# Patient Record
Sex: Male | Born: 1945 | Race: White | Hispanic: No | Smoking: Former smoker
Health system: Southern US, Community
[De-identification: ages and names within clinical notes are randomized; demographics above are authoritative.]

## PROBLEM LIST (undated history)

## (undated) DIAGNOSIS — E7800 Pure hypercholesterolemia, unspecified: Secondary | ICD-10-CM

## (undated) DIAGNOSIS — Z87891 Personal history of nicotine dependence: Secondary | ICD-10-CM

## (undated) DIAGNOSIS — K219 Gastro-esophageal reflux disease without esophagitis: Secondary | ICD-10-CM

## (undated) DIAGNOSIS — K5730 Diverticulosis of large intestine without perforation or abscess without bleeding: Secondary | ICD-10-CM

## (undated) DIAGNOSIS — R1310 Dysphagia, unspecified: Secondary | ICD-10-CM

## (undated) DIAGNOSIS — F419 Anxiety disorder, unspecified: Secondary | ICD-10-CM

## (undated) DIAGNOSIS — D490 Neoplasm of unspecified behavior of digestive system: Secondary | ICD-10-CM

## (undated) DIAGNOSIS — I10 Essential (primary) hypertension: Secondary | ICD-10-CM

## (undated) DIAGNOSIS — M1712 Unilateral primary osteoarthritis, left knee: Secondary | ICD-10-CM

## (undated) DIAGNOSIS — J189 Pneumonia, unspecified organism: Secondary | ICD-10-CM

## (undated) DIAGNOSIS — N529 Male erectile dysfunction, unspecified: Secondary | ICD-10-CM

## (undated) DIAGNOSIS — M1990 Unspecified osteoarthritis, unspecified site: Secondary | ICD-10-CM

## (undated) DIAGNOSIS — J449 Chronic obstructive pulmonary disease, unspecified: Secondary | ICD-10-CM

## (undated) DIAGNOSIS — I700 Atherosclerosis of aorta: Secondary | ICD-10-CM

## (undated) DIAGNOSIS — E119 Type 2 diabetes mellitus without complications: Secondary | ICD-10-CM

## (undated) HISTORY — PX: WISDOM TOOTH EXTRACTION: SHX21

## (undated) HISTORY — PX: HERNIA REPAIR: SHX51

## (undated) HISTORY — PX: COLONOSCOPY: SHX174

## (undated) HISTORY — PX: TONSILLECTOMY: SUR1361

---

## 2014-01-20 ENCOUNTER — Encounter: Payer: Self-pay | Admitting: Podiatry

## 2014-01-20 ENCOUNTER — Ambulatory Visit (INDEPENDENT_AMBULATORY_CARE_PROVIDER_SITE_OTHER): Payer: Medicare Other | Admitting: Podiatry

## 2014-01-20 VITALS — BP 153/85 | HR 62 | Resp 16 | Ht 71.0 in | Wt 200.0 lb

## 2014-01-20 DIAGNOSIS — Q828 Other specified congenital malformations of skin: Secondary | ICD-10-CM

## 2014-01-20 DIAGNOSIS — B070 Plantar wart: Secondary | ICD-10-CM

## 2014-01-20 DIAGNOSIS — M79672 Pain in left foot: Secondary | ICD-10-CM

## 2014-01-20 NOTE — Progress Notes (Signed)
   Subjective:    Patient ID: Archimedes Harold, male    DOB: [birth year], 69 y.o.   MRN: [id number]  HPI Comments: 69 year old male presents the office today with complaints of pain under his little toe on the side of the left foot. He states that the area has been present for a couple of months. He previously states that he use an over-the-counter acid/corn pad which alleviated the symptoms. He states that since stopping using this treatment the symptoms have returned. He states that he has pain to the end of day after standing on the area all day. He does not have pain in the mornings. Denies any recent injury or trauma to the area. Denies any recent redness or drainage from the site. No other complaints at this time.  Foot Pain      Review of Systems  All other systems reviewed and are negative.      Objective:   Physical Exam AAO x3, NAD DP/PT pulses palpable bilaterally, CRT less than 3 seconds Protective sensation intact with Simms Weinstein monofilament, vibratory sensation intact, Achilles tendon reflex intact Left foot submetatarsal 5 punctate hyperkeratotic lesion. Upon debridement there is no pinpoint bleeding or evidence of verruca. There is no underlying ulceration or any other clinical signs of infection at this time. No other open lesions or pre-ulcer lesions identified. There is mild discomfort rectally overlying this lesion. No other areas of pinpoint bony tenderness or pain the vibratory sensation bilaterally. There is prominent metatarsal heads plantarly with mild atrophy of the fat pad. MMT 5/5, ROM WNL No pain to calf compression, swelling, warmth, erythema.        Assessment & Plan:  69 year old male left submetatarsal 5 porokeratosis -Treatment options were discussed the patient include alternatives, risks, complications. -Lesion was sharply debrided without complications/bleeding. -A donut pad was placed around the lesion followed by salinocaine and an occlusive  dressing. Instructions were given to the patient as far as when to wash the area off. Discussed with him that if the area blisters can using antibiotic ointment and a Band-Aid overlying the area and to keep the area clean. Monitoring signs or symptoms of infection and directed to call the office should any occur. Discussed with him the lesion may likely recur.  -Follow-up as needed. In the meantime, encouraged to call the office with any questions, concerns, change in symptoms.

## 2014-01-21 ENCOUNTER — Encounter: Payer: Self-pay | Admitting: Podiatry

## 2015-02-26 ENCOUNTER — Encounter: Payer: Self-pay | Admitting: Emergency Medicine

## 2015-02-26 ENCOUNTER — Emergency Department
Admission: EM | Admit: 2015-02-26 | Discharge: 2015-02-26 | Disposition: A | Discharge status: Home / Self Care | Payer: Medicare Other | Attending: Emergency Medicine | Admitting: Emergency Medicine

## 2015-02-26 DIAGNOSIS — I10 Essential (primary) hypertension: Secondary | ICD-10-CM

## 2015-02-26 DIAGNOSIS — Z87891 Personal history of nicotine dependence: Secondary | ICD-10-CM | POA: Diagnosis not present

## 2015-02-26 DIAGNOSIS — M545 Low back pain: Secondary | ICD-10-CM | POA: Insufficient documentation

## 2015-02-26 DIAGNOSIS — R030 Elevated blood-pressure reading, without diagnosis of hypertension: Secondary | ICD-10-CM | POA: Diagnosis present

## 2015-02-26 LAB — BASIC METABOLIC PANEL
Anion gap: 4 — ABNORMAL LOW (ref 5–15)
BUN: 16 mg/dL (ref 6–20)
CHLORIDE: 104 mmol/L (ref 101–111)
CO2: 32 mmol/L (ref 22–32)
CREATININE: 0.98 mg/dL (ref 0.61–1.24)
Calcium: 9.5 mg/dL (ref 8.9–10.3)
GFR calc non Af Amer: >60 mL/min (ref >60)
Glucose, Bld: 103 mg/dL — ABNORMAL HIGH (ref 65–99)
POTASSIUM: 4.1 mmol/L (ref 3.5–5.1)
SODIUM: 140 mmol/L (ref 135–145)

## 2015-02-26 LAB — CBC
HEMATOCRIT: 44.6 % (ref 40.0–52.0)
Hemoglobin: 14.8 g/dL (ref 13.0–18.0)
MCH: 29 pg (ref 26.0–34.0)
MCHC: 33.2 g/dL (ref 32.0–36.0)
MCV: 87.2 fL (ref 80.0–100.0)
PLATELETS: 187 10*3/uL (ref 150–440)
RBC: 5.11 MIL/uL (ref 4.40–5.90)
RDW: 13.6 % (ref 11.5–14.5)
WBC: 9.6 10*3/uL (ref 3.8–10.6)

## 2015-02-26 MED ORDER — HYDROCHLOROTHIAZIDE 25 MG PO TABS: 25.0000 mg | ORAL_TABLET | Freq: Every day | ORAL | Status: DC

## 2015-02-26 NOTE — ED Notes (Signed)
Discharge instructions reviewed with patient. Pt verbalized understanding. Pt given prescription. Ambulated to lobby without difficulty or distress.

## 2015-02-26 NOTE — ED Provider Notes (Signed)
[facility] Emergency Department Provider Note     Time seen: ----------------------------------------- 2:19 PM on 02/26/2015 -----------------------------------------    I have reviewed the triage vital signs and the nursing notes.   HISTORY  Chief Complaint Hypertension    HPI Terry Sheppard is a 70 y.o. male who presents to ER after he noticed blood pressure was elevated yesterday afternoon. He took his blood pressure this morning and it was still elevated, reports slight headache.Patient reports to being under a lot of stress due to cleaning his house out. States his wife before she does not have accumulated a lot of things that he is now going through since she has passed. He is been doing a low lifting and has had some soreness in his low back. Otherwise he denies any complaints.   History reviewed. No pertinent past medical history.  There are no active problems to display for this patient.   History reviewed. No pertinent past surgical history.  Allergies Review of patient's allergies indicates no known allergies.  Social History Social History  Substance Use Topics  . Smoking status: Former Research scientist (life sciences)  . Smokeless tobacco: None  . Alcohol Use: 0.0 oz/week    0 Standard drinks or equivalent per week     Comment: rarely    Review of Systems Constitutional: Negative for fever. Eyes: Negative for visual changes. ENT: Negative for sore throat. Cardiovascular: Negative for chest pain. Respiratory: Negative for shortness of breath. Gastrointestinal: Negative for abdominal pain, vomiting and diarrhea. Genitourinary: Negative for dysuria. Musculoskeletal: Positive for soreness in his low back Skin: Negative for rash. Neurological: Positive for slight headache  10-point ROS otherwise negative.  ____________________________________________   PHYSICAL EXAM:  VITAL SIGNS: ED Triage Vitals  Enc Vitals Group     BP 02/26/15 1215 168/91  mmHg     Pulse Rate 02/26/15 1215 57     Resp 02/26/15 1215 18     Temp 02/26/15 1215 98 F (36.7 C)     Temp Source 02/26/15 1215 Oral     SpO2 02/26/15 1215 98 %     Weight 02/26/15 1215 200 lb (90.719 kg)     Height 02/26/15 1215 5\' 11"  (1.803 m)     Head Cir --      Peak Flow --      Pain Score --      Pain Loc --      Pain Edu? --      Excl. in [place]? --     Constitutional: Alert and oriented. Well appearing and in no distress. Eyes: Conjunctivae are normal. PERRL. Normal extraocular movements. ENT   Head: Normocephalic and atraumatic.   Nose: No congestion/rhinnorhea.   Mouth/Throat: Mucous membranes are moist.   Neck: No stridor. Cardiovascular: Normal rate, regular rhythm. Normal and symmetric distal pulses are present in all extremities. No murmurs, rubs, or gallops. Respiratory: Normal respiratory effort without tachypnea nor retractions. Breath sounds are clear and equal bilaterally. No wheezes/rales/rhonchi. Gastrointestinal: Soft and nontender. No distention. No abdominal bruits.  Musculoskeletal: Nontender with normal range of motion in all extremities. No joint effusions.  No lower extremity tenderness nor edema. Neurologic:  Normal speech and language. No gross focal neurologic deficits are appreciated. Speech is normal. No gait instability. Skin:  Skin is warm, dry and intact. No rash noted. Psychiatric: Mood and affect are normal. Speech and behavior are normal. Patient exhibits appropriate insight and judgment. ____________________________________________  EKG: Interpreted by me. Sinus bradycardia with rate of 58 bpm, normal  PR interval, normal QRS, normal QT interval. Normal axis. no evidence of acute infarction  ____________________________________________  ED COURSE:  Pertinent labs & imaging results that were available during my care of the patient were reviewed by me and considered in my medical decision making (see chart for details). Patient  is in no acute distress, appears well clinically. Essentially with a symptomatically hypertension. Labs were drawn prior to my evaluation ____________________________________________    LABS (pertinent positives/negatives)  Labs Reviewed  BASIC METABOLIC PANEL - Abnormal; Notable for the following:    Glucose, Bld 103 (*)    Anion gap 4 (*)    All other components within normal limits  CBC  CBG MONITORING, ED  ____________________________________________  FINAL ASSESSMENT AND PLAN  Hypertension  Plan: Patient with labs as dictated above. Patient looks well, advised his blood pressure may be elevated from the stress that he has been under and his back pain. I will prescribe HCTZ for him to start taking and has been referred to primary care doctor for follow-up.   Earleen Newport, MD   Earleen Newport, MD 02/26/15 [phone]

## 2015-02-26 NOTE — Discharge Instructions (Signed)
Hypertension Hypertension, commonly called high blood pressure, is when the force of blood pumping through your arteries is too strong. Your arteries are the blood vessels that carry blood from your heart throughout your body. A blood pressure reading consists of a higher number over a lower number, such as 110/72. The higher number (systolic) is the pressure inside your arteries when your heart pumps. The lower number (diastolic) is the pressure inside your arteries when your heart relaxes. Ideally you want your blood pressure below 120/80. Hypertension forces your heart to work harder to pump blood. Your arteries may become narrow or stiff. Having untreated or uncontrolled hypertension can cause heart attack, stroke, kidney disease, and other problems. RISK FACTORS Some risk factors for high blood pressure are controllable. Others are not.  Risk factors you cannot control include:   Race. You may be at higher risk if you are African American.  Age. Risk increases with age.  Gender. Men are at higher risk than women before age 45 years. After age 65, women are at higher risk than men. Risk factors you can control include:  Not getting enough exercise or physical activity.  Being overweight.  Getting too much fat, sugar, calories, or salt in your diet.  Drinking too much alcohol. SIGNS AND SYMPTOMS Hypertension does not usually cause signs or symptoms. Extremely high blood pressure (hypertensive crisis) may cause headache, anxiety, shortness of breath, and nosebleed. DIAGNOSIS To check if you have hypertension, your health care provider will measure your blood pressure while you are seated, with your arm held at the level of your heart. It should be measured at least twice using the same arm. Certain conditions can cause a difference in blood pressure between your right and left arms. A blood pressure reading that is higher than normal on one occasion does not mean that you need treatment. If  it is not clear whether you have high blood pressure, you may be asked to return on a different day to have your blood pressure checked again. Or, you may be asked to monitor your blood pressure at home for 1 or more weeks. TREATMENT Treating high blood pressure includes making lifestyle changes and possibly taking medicine. Living a healthy lifestyle can help lower high blood pressure. You may need to change some of your habits. Lifestyle changes may include:  Following the DASH diet. This diet is high in fruits, vegetables, and whole grains. It is low in salt, red meat, and added sugars.  Keep your sodium intake below 2,300 mg per day.  Getting at least 30-45 minutes of aerobic exercise at least 4 times per week.  Losing weight if necessary.  Not smoking.  Limiting alcoholic beverages.  Learning ways to reduce stress. Your health care provider may prescribe medicine if lifestyle changes are not enough to get your blood pressure under control, and if one of the following is true:  You are 18-59 years of age and your systolic blood pressure is above 140.  You are 60 years of age or older, and your systolic blood pressure is above 150.  Your diastolic blood pressure is above 90.  You have diabetes, and your systolic blood pressure is over 140 or your diastolic blood pressure is over 90.  You have kidney disease and your blood pressure is above 140/90.  You have heart disease and your blood pressure is above 140/90. Your personal target blood pressure may vary depending on your medical conditions, your age, and other factors. HOME CARE INSTRUCTIONS    Have your blood pressure rechecked as directed by your health care provider.   Take medicines only as directed by your health care provider. Follow the directions carefully. Blood pressure medicines must be taken as prescribed. The medicine does not work as well when you skip doses. Skipping doses also puts you at risk for  problems.  Do not smoke.   Monitor your blood pressure at home as directed by your health care provider. SEEK MEDICAL CARE IF:   You think you are having a reaction to medicines taken.  You have recurrent headaches or feel dizzy.  You have swelling in your ankles.  You have trouble with your vision. SEEK IMMEDIATE MEDICAL CARE IF:  You develop a severe headache or confusion.  You have unusual weakness, numbness, or feel faint.  You have severe chest or abdominal pain.  You vomit repeatedly.  You have trouble breathing. MAKE SURE YOU:   Understand these instructions.  Will watch your condition.  Will get help right away if you are not doing well or get worse.   This information is not intended to replace advice given to you by your health care provider. Make sure you discuss any questions you have with your health care provider.   Document Released: 12/26/2004 Document Revised: 05/12/2014 Document Reviewed: 10/18/2012 Elsevier Interactive Patient Education 2016 [employer]  

## 2015-02-26 NOTE — ED Notes (Addendum)
Pt comes in reporting BP higher than his normal for the past two days. Denies chest pain, SOB, N/V, dizziness. Reports mild headaches at times. Reports that he has had some back pain that he recently saw a chiropractor for. Has recently been under a lot of stress. Is not currently seeing a PCP.

## 2015-02-26 NOTE — ED Notes (Signed)
States he noticed that his blood pressure was elevated yesterday afternoon.. Then took b/p this am and noticed that b/p was elevated   Also noted that he had a slight headache

## 2015-04-15 DIAGNOSIS — I10 Essential (primary) hypertension: Secondary | ICD-10-CM | POA: Insufficient documentation

## 2015-05-25 ENCOUNTER — Ambulatory Visit
Admission: RE | Admit: 2015-05-25 | Discharge: 2015-05-25 | Disposition: A | Discharge status: Home / Self Care | Payer: Medicare Other | Source: Ambulatory Visit | Attending: Adult Health | Admitting: Adult Health

## 2015-05-25 ENCOUNTER — Other Ambulatory Visit: Payer: Self-pay | Admitting: Adult Health

## 2015-05-25 DIAGNOSIS — R1011 Right upper quadrant pain: Secondary | ICD-10-CM

## 2015-05-25 DIAGNOSIS — K7689 Other specified diseases of liver: Secondary | ICD-10-CM | POA: Insufficient documentation

## 2015-09-15 DIAGNOSIS — E119 Type 2 diabetes mellitus without complications: Secondary | ICD-10-CM | POA: Insufficient documentation

## 2017-07-18 ENCOUNTER — Other Ambulatory Visit: Payer: Self-pay | Admitting: Student

## 2017-07-18 DIAGNOSIS — Z136 Encounter for screening for cardiovascular disorders: Secondary | ICD-10-CM

## 2017-07-18 DIAGNOSIS — Z87891 Personal history of nicotine dependence: Secondary | ICD-10-CM

## 2017-07-23 ENCOUNTER — Ambulatory Visit: Payer: Medicare Other

## 2017-07-23 ENCOUNTER — Telehealth: Payer: Self-pay | Admitting: *Deleted

## 2017-07-23 NOTE — Telephone Encounter (Signed)
Patient contacted me regarding a lung cancer screening scan. Reports smoking cessation 20 + years ago. Notified patient that he is not eligible for lung cancer screening scanning due to quit date > 15 years ago. Verbalizes understanding.

## 2017-10-24 ENCOUNTER — Ambulatory Visit: Payer: Medicare Other

## 2017-10-29 ENCOUNTER — Ambulatory Visit
Admission: RE | Admit: 2017-10-29 | Discharge: 2017-10-29 | Disposition: A | Discharge status: Home / Self Care | Payer: Medicare Other | Source: Ambulatory Visit | Attending: Student | Admitting: Student

## 2017-10-29 DIAGNOSIS — I700 Atherosclerosis of aorta: Secondary | ICD-10-CM | POA: Insufficient documentation

## 2017-10-29 DIAGNOSIS — Z87891 Personal history of nicotine dependence: Secondary | ICD-10-CM | POA: Insufficient documentation

## 2017-10-29 DIAGNOSIS — Z136 Encounter for screening for cardiovascular disorders: Secondary | ICD-10-CM | POA: Diagnosis not present

## 2017-11-08 DIAGNOSIS — I700 Atherosclerosis of aorta: Secondary | ICD-10-CM | POA: Insufficient documentation

## 2018-01-17 DIAGNOSIS — N529 Male erectile dysfunction, unspecified: Secondary | ICD-10-CM | POA: Insufficient documentation

## 2018-01-17 DIAGNOSIS — J449 Chronic obstructive pulmonary disease, unspecified: Secondary | ICD-10-CM | POA: Insufficient documentation

## 2018-07-07 DIAGNOSIS — M1712 Unilateral primary osteoarthritis, left knee: Secondary | ICD-10-CM | POA: Insufficient documentation

## 2019-05-14 ENCOUNTER — Ambulatory Visit: Payer: Self-pay | Admitting: Surgery

## 2019-05-14 NOTE — H&P (Signed)
Subjective:   CC: Non-recurrent unilateral inguinal hernia without obstruction or gangrene [K40.90]  HPI:  Terry Sheppard is a 74 y.o. male who was referred by Lendon Colonel, PA for evaluation of above. Symptoms were first noted 1 month ago. Pain is sharp and burning, confined to the right groin, without radiation.  Associated with nothing specific, exacerbated by sitting, alleviated by standing and taking tylenol.     Past Medical History:  has a past medical history of Arthritis, COPD (chronic obstructive pulmonary disease) (CMS-HCC), Former tobacco use, GERD (gastroesophageal reflux disease), HLD (hyperlipidemia), PNA (pneumonia), and Right wrist fracture.  Past Surgical History:       Past Surgical History:  Procedure Laterality Date  . TONSILLECTOMY      Family History: family history includes Breast cancer (age of onset: 45) in his mother; Fibrocystic breast disease in his daughter; No Known Problems in his daughter, sister, sister, son, and son; Pancreatic cancer in his maternal grandfather; Skin cancer in his father.  Social History:  reports that he quit smoking about 22 years ago. His smoking use included cigarettes. He started smoking about 62 years ago. He smoked 2.00 packs per day. He has never used smokeless tobacco. He reports current alcohol use. He reports that he does not use drugs.  Current Medications: has a current medication list which includes the following prescription(s): aspirin, citalopram, hydrochlorothiazide, losartan, meloxicam, omega 3-dha-epa-fish oil, pravastatin, and sildenafil.  Allergies:       Allergies as of 05/14/2019 - Reviewed 05/14/2019  Allergen Reaction Noted  . Lisinopril Cough 10/13/2015    ROS:  A 15 point review of systems was performed and pertinent positives and negatives noted in HPI   Objective:   BP (!) 175/90   Pulse 61   Ht 172.7 cm (5\' 8" )   Wt 83.5 kg (184 lb)   BMI 27.98 kg/m   Constitutional :   alert, appears stated age, cooperative and no distress  Lymphatics/Throat:  no asymmetry, masses, or scars  Respiratory:  clear to auscultation bilaterally  Cardiovascular:  regular rate and rhythm  Gastrointestinal: soft, non-tender; bowel sounds normal; no masses,  no organomegaly. inguinal hernia noted.  small, reducible, no overlying skin changes and RIGHT  Musculoskeletal: Steady gait and movement  Skin: Cool and moist, no visible surgical scars   Psychiatric: Normal affect, non-agitated, not confused       LABS:  N/A   RADS: N/A Assessment:       Non-recurrent unilateral inguinal hernia without obstruction or gangrene [K40.90] RIGHT  Plan:   1. Non-recurrent unilateral inguinal hernia without obstruction or gangrene [K40.90]   Discussed the risk of surgery including recurrence, which can be up to 50% in the case of incisional or complex hernias, possible use of prosthetic materials (mesh) and the increased risk of mesh infxn if used, bleeding, chronic pain, post-op infxn, post-op SBO or ileus, and possible re-operation to address said risks. The risks of general anesthetic, if used, includes MI, CVA, sudden death or even reaction to anesthetic medications also discussed. Alternatives include continued observation.  Benefits include possible symptom relief, prevention of incarceration, strangulation, enlargement in size over time, and the risk of emergency surgery in the face of strangulation.   Typical post-op recovery time of 3-5 days with 2 weeks of activity restrictions were also discussed.  ED return precautions given for sudden increase in pain, size of hernia with accompanying fever, nausea, and/or vomiting.  The patient verbalized understanding and all questions were answered  to the patient's satisfaction.   2. Patient has elected to proceed with surgical treatment. Procedure will be scheduled.  Written consent was obtained.Marland Kitchen RIGHT, robotic assisted  laparoscopic

## 2019-05-14 NOTE — H&P (View-Only) (Signed)
Subjective:   CC: Non-recurrent unilateral inguinal hernia without obstruction or gangrene [K40.90]  HPI:  Terry Sheppard is a 74 y.o. male who was referred by Lendon Colonel, PA for evaluation of above. Symptoms were first noted 1 month ago. Pain is sharp and burning, confined to the right groin, without radiation.  Associated with nothing specific, exacerbated by sitting, alleviated by standing and taking tylenol.     Past Medical History:  has a past medical history of Arthritis, COPD (chronic obstructive pulmonary disease) (CMS-HCC), Former tobacco use, GERD (gastroesophageal reflux disease), HLD (hyperlipidemia), PNA (pneumonia), and Right wrist fracture.  Past Surgical History:       Past Surgical History:  Procedure Laterality Date  . TONSILLECTOMY      Family History: family history includes Breast cancer (age of onset: 71) in his mother; Fibrocystic breast disease in his daughter; No Known Problems in his daughter, sister, sister, son, and son; Pancreatic cancer in his maternal grandfather; Skin cancer in his father.  Social History:  reports that he quit smoking about 22 years ago. His smoking use included cigarettes. He started smoking about 62 years ago. He smoked 2.00 packs per day. He has never used smokeless tobacco. He reports current alcohol use. He reports that he does not use drugs.  Current Medications: has a current medication list which includes the following prescription(s): aspirin, citalopram, hydrochlorothiazide, losartan, meloxicam, omega 3-dha-epa-fish oil, pravastatin, and sildenafil.  Allergies:       Allergies as of 05/14/2019 - Reviewed 05/14/2019  Allergen Reaction Noted  . Lisinopril Cough 10/13/2015    ROS:  A 15 point review of systems was performed and pertinent positives and negatives noted in HPI   Objective:   BP (!) 175/90   Pulse 61   Ht 172.7 cm (5\' 8" )   Wt 83.5 kg (184 lb)   BMI 27.98 kg/m   Constitutional :   alert, appears stated age, cooperative and no distress  Lymphatics/Throat:  no asymmetry, masses, or scars  Respiratory:  clear to auscultation bilaterally  Cardiovascular:  regular rate and rhythm  Gastrointestinal: soft, non-tender; bowel sounds normal; no masses,  no organomegaly. inguinal hernia noted.  small, reducible, no overlying skin changes and RIGHT  Musculoskeletal: Steady gait and movement  Skin: Cool and moist, no visible surgical scars   Psychiatric: Normal affect, non-agitated, not confused       LABS:  N/A   RADS: N/A Assessment:       Non-recurrent unilateral inguinal hernia without obstruction or gangrene [K40.90] RIGHT  Plan:   1. Non-recurrent unilateral inguinal hernia without obstruction or gangrene [K40.90]   Discussed the risk of surgery including recurrence, which can be up to 50% in the case of incisional or complex hernias, possible use of prosthetic materials (mesh) and the increased risk of mesh infxn if used, bleeding, chronic pain, post-op infxn, post-op SBO or ileus, and possible re-operation to address said risks. The risks of general anesthetic, if used, includes MI, CVA, sudden death or even reaction to anesthetic medications also discussed. Alternatives include continued observation.  Benefits include possible symptom relief, prevention of incarceration, strangulation, enlargement in size over time, and the risk of emergency surgery in the face of strangulation.   Typical post-op recovery time of 3-5 days with 2 weeks of activity restrictions were also discussed.  ED return precautions given for sudden increase in pain, size of hernia with accompanying fever, nausea, and/or vomiting.  The patient verbalized understanding and all questions were answered  to the patient's satisfaction.   2. Patient has elected to proceed with surgical treatment. Procedure will be scheduled.  Written consent was obtained.Marland Kitchen RIGHT, robotic assisted  laparoscopic

## 2019-06-03 ENCOUNTER — Encounter
Admission: RE | Admit: 2019-06-03 | Discharge: 2019-06-03 | Disposition: A | Discharge status: Home / Self Care | Payer: Medicare Other | Source: Ambulatory Visit | Attending: Surgery | Admitting: Surgery

## 2019-06-03 ENCOUNTER — Other Ambulatory Visit: Payer: Self-pay

## 2019-06-03 DIAGNOSIS — Z01818 Encounter for other preprocedural examination: Secondary | ICD-10-CM | POA: Diagnosis not present

## 2019-06-03 DIAGNOSIS — I491 Atrial premature depolarization: Secondary | ICD-10-CM | POA: Insufficient documentation

## 2019-06-03 DIAGNOSIS — R9431 Abnormal electrocardiogram [ECG] [EKG]: Secondary | ICD-10-CM | POA: Diagnosis not present

## 2019-06-03 DIAGNOSIS — I10 Essential (primary) hypertension: Secondary | ICD-10-CM | POA: Insufficient documentation

## 2019-06-03 HISTORY — DX: Unspecified osteoarthritis, unspecified site: M19.90

## 2019-06-03 HISTORY — DX: Chronic obstructive pulmonary disease, unspecified: J44.9

## 2019-06-03 HISTORY — DX: Pure hypercholesterolemia, unspecified: E78.00

## 2019-06-03 HISTORY — DX: Gastro-esophageal reflux disease without esophagitis: K21.9

## 2019-06-03 HISTORY — DX: Essential (primary) hypertension: I10

## 2019-06-03 NOTE — Patient Instructions (Addendum)
Your procedure is scheduled on: 06-13-19 FRIDAY Report to Same Day Surgery 2nd floor medical mall [facility] Entrance-take elevator on left to 2nd floor.  Check in with surgery information desk.) To find out your arrival time please call [phone] between 1PM - 3PM on 06-12-19 THURSDAY  Remember: Instructions that are not followed completely may result in serious medical risk, up to and including death, or upon the discretion of your surgeon and anesthesiologist your surgery may need to be rescheduled.    _x___ 1. Do not eat food after midnight the night before your procedure. NO GUM OR CANDY AFTER MIDNIGHT. You may drink clear liquids up to 2 hours before you are scheduled to arrive at the hospital for your procedure.  Do not drink clear liquids within 2 hours of your scheduled arrival to the hospital.  Clear liquids include  --Water or Apple juice without pulp  --Gatorade  --Black Coffee or Clear Tea (No milk, no creamers, do not add anything to the coffee or Tea-ok to add sugar)   ____Ensure clear carbohydrate drink on the way to the hospital for bariatric patients  ____Ensure clear carbohydrate drink 3 hours before surgery.    __x__ 2. No Alcohol for 24 hours before or after surgery.   __x__3. No Smoking or e-cigarettes for 24 prior to surgery.  Do not use any chewable tobacco products for at least 6 hour prior to surgery   ____  4. Bring all medications with you on the day of surgery if instructed.    __x__ 5. Notify your doctor if there is any change in your medical condition     (cold, fever, infections).    x___6. On the morning of surgery brush your teeth with toothpaste and water.  You may rinse your mouth with mouth wash if you wish.  Do not swallow any toothpaste or mouthwash.   Do not wear jewelry, make-up, hairpins, clips or nail polish.  Do not wear lotions, powders, or perfumes.   Do not shave 48 hours prior to surgery. Men may shave face and neck.  Do not bring  valuables to the hospital.    [facility] is not responsible for any belongings or valuables.               Contacts, dentures or bridgework may not be worn into surgery.  Leave your suitcase in the car. After surgery it may be brought to your room.  For patients admitted to the hospital, discharge time is determined by your treatment team.  _  Patients discharged the day of surgery will not be allowed to drive home.  You will need someone to drive you home and stay with you the night of your procedure.    Please read over the following fact sheets that you were given:   [facility] Preparing for Surgery  _x___ TAKE THE FOLLOWING MEDICATION THE MORNING OF SURGERY WITH A SMALL SIP OF WATER. These include:  1. CELEXA (CITALOPRAM)  2. PRAVASTATIN (PRAVACHOL)  3.  4.  5.  6.  ____Fleets enema or Magnesium Citrate as directed.   _x___ Use CHG Soap or sage wipes as directed on instruction sheet   ____ Use inhalers on the day of surgery and bring to hospital day of surgery  ____ Stop Metformin and Janumet 2 days prior to surgery.    ____ Take 1/2 of usual insulin dose the night before surgery and none on the morning surgery.   _x___ Follow recommendations from Cardiologist,  Pulmonologist or PCP regarding stopping Aspirin, Coumadin, Plavix ,Eliquis, Effient, or Pradaxa, and Pletal-ASPIRIN WAS STOPPED WEEKS AGO  X____Stop Anti-inflammatories such as Advil, Aleve, Ibuprofen, Motrin, Naproxen,MOBIC (MELOXICAM) Naprosyn, Goodies powders or aspirin products 7 DAYS PRIOR TO SURGERY-OK to take Tylenol    ____ Stop supplements until after surgery.    ____ Bring C-Pap to the hospital.

## 2019-06-04 ENCOUNTER — Other Ambulatory Visit: Payer: Self-pay

## 2019-06-04 ENCOUNTER — Encounter
Admission: RE | Admit: 2019-06-04 | Discharge: 2019-06-04 | Disposition: A | Discharge status: Home / Self Care | Payer: Medicare Other | Source: Ambulatory Visit | Attending: Surgery | Admitting: Surgery

## 2019-06-04 DIAGNOSIS — Z01818 Encounter for other preprocedural examination: Secondary | ICD-10-CM | POA: Diagnosis not present

## 2019-06-04 LAB — POTASSIUM: Potassium: 3.7 mmol/L (ref 3.5–5.1)

## 2019-06-11 ENCOUNTER — Other Ambulatory Visit
Admission: RE | Admit: 2019-06-11 | Discharge: 2019-06-11 | Disposition: A | Discharge status: Home / Self Care | Payer: Medicare Other | Source: Ambulatory Visit | Attending: Surgery | Admitting: Surgery

## 2019-06-11 ENCOUNTER — Other Ambulatory Visit: Payer: Self-pay

## 2019-06-11 DIAGNOSIS — Z20822 Contact with and (suspected) exposure to covid-19: Secondary | ICD-10-CM | POA: Insufficient documentation

## 2019-06-11 DIAGNOSIS — Z01812 Encounter for preprocedural laboratory examination: Secondary | ICD-10-CM | POA: Diagnosis present

## 2019-06-11 LAB — SARS CORONAVIRUS 2 (TAT 6-24 HRS): SARS Coronavirus 2: NEGATIVE

## 2019-06-13 ENCOUNTER — Ambulatory Visit
Admission: RE | Admit: 2019-06-13 | Discharge: 2019-06-13 | Disposition: A | Discharge status: Home / Self Care | Payer: Medicare Other | Attending: Surgery | Admitting: Surgery

## 2019-06-13 ENCOUNTER — Encounter
Admission: RE | Disposition: A | Discharge status: Home / Self Care | Payer: Self-pay | Source: Home / Self Care | Attending: Surgery

## 2019-06-13 ENCOUNTER — Ambulatory Visit: Payer: Medicare Other | Admitting: Anesthesiology

## 2019-06-13 ENCOUNTER — Encounter: Payer: Self-pay | Admitting: Surgery

## 2019-06-13 ENCOUNTER — Other Ambulatory Visit: Payer: Self-pay

## 2019-06-13 DIAGNOSIS — Z888 Allergy status to other drugs, medicaments and biological substances status: Secondary | ICD-10-CM | POA: Insufficient documentation

## 2019-06-13 DIAGNOSIS — Z87891 Personal history of nicotine dependence: Secondary | ICD-10-CM | POA: Insufficient documentation

## 2019-06-13 DIAGNOSIS — K219 Gastro-esophageal reflux disease without esophagitis: Secondary | ICD-10-CM | POA: Insufficient documentation

## 2019-06-13 DIAGNOSIS — M1990 Unspecified osteoarthritis, unspecified site: Secondary | ICD-10-CM | POA: Insufficient documentation

## 2019-06-13 DIAGNOSIS — E785 Hyperlipidemia, unspecified: Secondary | ICD-10-CM | POA: Diagnosis not present

## 2019-06-13 DIAGNOSIS — K4090 Unilateral inguinal hernia, without obstruction or gangrene, not specified as recurrent: Secondary | ICD-10-CM | POA: Diagnosis present

## 2019-06-13 DIAGNOSIS — J449 Chronic obstructive pulmonary disease, unspecified: Secondary | ICD-10-CM | POA: Diagnosis not present

## 2019-06-13 HISTORY — PX: XI ROBOTIC ASSISTED INGUINAL HERNIA REPAIR WITH MESH: SHX6706

## 2019-06-13 SURGERY — REPAIR, HERNIA, INGUINAL, ROBOT-ASSISTED, LAPAROSCOPIC, USING MESH
Anesthesia: General | Site: Abdomen | Laterality: Right

## 2019-06-13 MED ORDER — ONDANSETRON HCL 4 MG/2ML IJ SOLN
INTRAMUSCULAR | Status: AC
  Filled 2019-06-13: qty 2

## 2019-06-13 MED ORDER — ONDANSETRON HCL 4 MG/2ML IJ SOLN: 4.0000 mg | Freq: Once | INTRAMUSCULAR | Status: DC | PRN

## 2019-06-13 MED ORDER — GABAPENTIN 300 MG PO CAPS: 300.0000 mg | ORAL_CAPSULE | ORAL | Status: AC

## 2019-06-13 MED ORDER — MIDAZOLAM HCL 2 MG/2ML IJ SOLN
INTRAMUSCULAR | Status: AC
  Filled 2019-06-13: qty 2

## 2019-06-13 MED ORDER — GLYCOPYRROLATE 0.2 MG/ML IJ SOLN
INTRAMUSCULAR | Status: DC | PRN
Start: 2019-06-13 — End: 2019-06-13
  Administered 2019-06-13: .2 mg via INTRAVENOUS

## 2019-06-13 MED ORDER — FENTANYL CITRATE (PF) 250 MCG/5ML IJ SOLN
INTRAMUSCULAR | Status: DC | PRN
  Administered 2019-06-13: 50 ug via INTRAVENOUS

## 2019-06-13 MED ORDER — MIDAZOLAM HCL 2 MG/2ML IJ SOLN
INTRAMUSCULAR | Status: DC | PRN
  Administered 2019-06-13: 1 mg via INTRAVENOUS

## 2019-06-13 MED ORDER — ACETAMINOPHEN 500 MG PO TABS
ORAL_TABLET | ORAL | Status: AC
  Administered 2019-06-13: 1000 mg via ORAL
  Filled 2019-06-13: qty 2

## 2019-06-13 MED ORDER — DOCUSATE SODIUM 100 MG PO CAPS: 100.0000 mg | ORAL_CAPSULE | Freq: Two times a day (BID) | ORAL | 0 refills | Status: AC | PRN

## 2019-06-13 MED ORDER — DEXAMETHASONE SODIUM PHOSPHATE 10 MG/ML IJ SOLN
INTRAMUSCULAR | Status: DC | PRN
  Administered 2019-06-13: 4 mg via INTRAVENOUS

## 2019-06-13 MED ORDER — FAMOTIDINE 20 MG PO TABS
ORAL_TABLET | ORAL | Status: AC
  Administered 2019-06-13: 20 mg via ORAL
  Filled 2019-06-13: qty 1

## 2019-06-13 MED ORDER — FENTANYL CITRATE (PF) 100 MCG/2ML IJ SOLN
25.0000 ug | INTRAMUSCULAR | Status: DC | PRN
  Administered 2019-06-13 (×3): 25 ug via INTRAVENOUS

## 2019-06-13 MED ORDER — HYDROCODONE-ACETAMINOPHEN 5-325 MG PO TABS: 1.0000 | ORAL_TABLET | Freq: Four times a day (QID) | ORAL | 0 refills | Status: DC | PRN

## 2019-06-13 MED ORDER — FAMOTIDINE 20 MG PO TABS: 20.0000 mg | ORAL_TABLET | Freq: Once | ORAL | Status: AC

## 2019-06-13 MED ORDER — BUPIVACAINE LIPOSOME 1.3 % IJ SUSP
INTRAMUSCULAR | Status: DC | PRN
  Administered 2019-06-13: 20 mL

## 2019-06-13 MED ORDER — CEFAZOLIN SODIUM-DEXTROSE 2-4 GM/100ML-% IV SOLN
2.0000 g | INTRAVENOUS | Status: AC
  Administered 2019-06-13: 2 g via INTRAVENOUS

## 2019-06-13 MED ORDER — ACETAMINOPHEN 325 MG PO TABS: 650.0000 mg | ORAL_TABLET | Freq: Three times a day (TID) | ORAL | 0 refills | Status: AC | PRN

## 2019-06-13 MED ORDER — CHLORHEXIDINE GLUCONATE CLOTH 2 % EX PADS: 6.0000 | MEDICATED_PAD | Freq: Once | CUTANEOUS | Status: DC

## 2019-06-13 MED ORDER — BUPIVACAINE LIPOSOME 1.3 % IJ SUSP
INTRAMUSCULAR | Status: AC
  Filled 2019-06-13: qty 20

## 2019-06-13 MED ORDER — FENTANYL CITRATE (PF) 100 MCG/2ML IJ SOLN
INTRAMUSCULAR | Status: AC
  Administered 2019-06-13: 25 ug via INTRAVENOUS
  Filled 2019-06-13: qty 2

## 2019-06-13 MED ORDER — ONDANSETRON HCL 4 MG/2ML IJ SOLN
INTRAMUSCULAR | Status: DC | PRN
  Administered 2019-06-13: 4 mg via INTRAVENOUS

## 2019-06-13 MED ORDER — ROCURONIUM BROMIDE 100 MG/10ML IV SOLN
INTRAVENOUS | Status: DC | PRN
  Administered 2019-06-13: 20 mg via INTRAVENOUS
  Administered 2019-06-13: 50 mg via INTRAVENOUS

## 2019-06-13 MED ORDER — OXYCODONE HCL 5 MG PO TABS
ORAL_TABLET | ORAL | Status: AC
  Filled 2019-06-13: qty 1

## 2019-06-13 MED ORDER — SUGAMMADEX SODIUM 200 MG/2ML IV SOLN
INTRAVENOUS | Status: DC | PRN
  Administered 2019-06-13: 200 mg via INTRAVENOUS

## 2019-06-13 MED ORDER — OXYCODONE HCL 5 MG/5ML PO SOLN: 5.0000 mg | Freq: Once | ORAL | Status: AC | PRN

## 2019-06-13 MED ORDER — CEFAZOLIN SODIUM-DEXTROSE 2-4 GM/100ML-% IV SOLN
INTRAVENOUS | Status: AC
  Filled 2019-06-13: qty 100

## 2019-06-13 MED ORDER — BUPIVACAINE-EPINEPHRINE (PF) 0.5% -1:200000 IJ SOLN
INTRAMUSCULAR | Status: AC
  Filled 2019-06-13: qty 90

## 2019-06-13 MED ORDER — CHLORHEXIDINE GLUCONATE 0.12 % MT SOLN
OROMUCOSAL | Status: AC
  Administered 2019-06-13: 15 mL via OROMUCOSAL
  Filled 2019-06-13: qty 15

## 2019-06-13 MED ORDER — OXYCODONE HCL 5 MG PO TABS
5.0000 mg | ORAL_TABLET | Freq: Once | ORAL | Status: AC | PRN
  Administered 2019-06-13: 5 mg via ORAL

## 2019-06-13 MED ORDER — PROPOFOL 10 MG/ML IV BOLUS
INTRAVENOUS | Status: AC
  Filled 2019-06-13: qty 20

## 2019-06-13 MED ORDER — CELECOXIB 200 MG PO CAPS: 200.0000 mg | ORAL_CAPSULE | ORAL | Status: AC

## 2019-06-13 MED ORDER — SUCCINYLCHOLINE CHLORIDE 200 MG/10ML IV SOSY
PREFILLED_SYRINGE | INTRAVENOUS | Status: AC
  Filled 2019-06-13: qty 10

## 2019-06-13 MED ORDER — CHLORHEXIDINE GLUCONATE 0.12 % MT SOLN: 15.0000 mL | Freq: Once | OROMUCOSAL | Status: AC

## 2019-06-13 MED ORDER — FENTANYL CITRATE (PF) 100 MCG/2ML IJ SOLN
INTRAMUSCULAR | Status: AC
  Filled 2019-06-13: qty 2

## 2019-06-13 MED ORDER — CELECOXIB 200 MG PO CAPS
ORAL_CAPSULE | ORAL | Status: AC
  Administered 2019-06-13: 200 mg via ORAL
  Filled 2019-06-13: qty 1

## 2019-06-13 MED ORDER — BUPIVACAINE-EPINEPHRINE 0.5% -1:200000 IJ SOLN
INTRAMUSCULAR | Status: DC | PRN
  Administered 2019-06-13: 10 mL

## 2019-06-13 MED ORDER — LACTATED RINGERS IV SOLN: INTRAVENOUS | Status: DC

## 2019-06-13 MED ORDER — ACETAMINOPHEN 500 MG PO TABS: 1000.0000 mg | ORAL_TABLET | ORAL | Status: AC

## 2019-06-13 MED ORDER — LIDOCAINE HCL (CARDIAC) PF 100 MG/5ML IV SOSY
PREFILLED_SYRINGE | INTRAVENOUS | Status: DC | PRN
  Administered 2019-06-13: 80 mg via INTRAVENOUS

## 2019-06-13 MED ORDER — ORAL CARE MOUTH RINSE: 15.0000 mL | Freq: Once | OROMUCOSAL | Status: AC

## 2019-06-13 MED ORDER — PROPOFOL 10 MG/ML IV BOLUS
INTRAVENOUS | Status: DC | PRN
  Administered 2019-06-13: 50 mg via INTRAVENOUS
  Administered 2019-06-13: 150 mg via INTRAVENOUS

## 2019-06-13 MED ORDER — GABAPENTIN 300 MG PO CAPS
ORAL_CAPSULE | ORAL | Status: AC
  Administered 2019-06-13: 300 mg via ORAL
  Filled 2019-06-13: qty 1

## 2019-06-13 SURGICAL SUPPLY — 46 items
BAG INFUSER PRESSURE 100CC (MISCELLANEOUS) IMPLANT
BLADE SURG SZ11 CARB STEEL (BLADE) ×2 IMPLANT
BNDG GAUZE 4.5X4.1 6PLY STRL (MISCELLANEOUS) ×2 IMPLANT
CANISTER SUCT 1200ML W/VALVE (MISCELLANEOUS) ×2 IMPLANT
CHLORAPREP W/TINT 26 (MISCELLANEOUS) ×2 IMPLANT
COVER TIP SHEARS 8 DVNC (MISCELLANEOUS) ×1 IMPLANT
COVER TIP SHEARS 8MM DA VINCI (MISCELLANEOUS) ×1
COVER WAND RF STERILE (DRAPES) IMPLANT
DEFOGGER SCOPE WARMER CLEARIFY (MISCELLANEOUS) ×2 IMPLANT
DERMABOND ADVANCED (GAUZE/BANDAGES/DRESSINGS) ×1
DERMABOND ADVANCED .7 DNX12 (GAUZE/BANDAGES/DRESSINGS) ×1 IMPLANT
DRAPE ARM DVNC X/XI (DISPOSABLE) ×3 IMPLANT
DRAPE COLUMN DVNC XI (DISPOSABLE) ×1 IMPLANT
DRAPE DA VINCI XI ARM (DISPOSABLE) ×3
DRAPE DA VINCI XI COLUMN (DISPOSABLE) ×1
ELECT CAUTERY BLADE 6.4 (BLADE) IMPLANT
ELECT REM PT RETURN 9FT ADLT (ELECTROSURGICAL) ×2
ELECTRODE REM PT RTRN 9FT ADLT (ELECTROSURGICAL) ×1 IMPLANT
GLOVE BIOGEL PI IND STRL 7.0 (GLOVE) ×2 IMPLANT
GLOVE BIOGEL PI INDICATOR 7.0 (GLOVE) ×2
GLOVE SURG SYN 6.5 ES PF (GLOVE) ×4 IMPLANT
GOWN STRL REUS W/ TWL LRG LVL3 (GOWN DISPOSABLE) ×3 IMPLANT
GOWN STRL REUS W/TWL LRG LVL3 (GOWN DISPOSABLE) ×3
IRRIGATOR SUCT 8 DISP DVNC XI (IRRIGATION / IRRIGATOR) IMPLANT
IRRIGATOR SUCTION 8MM XI DISP (IRRIGATION / IRRIGATOR)
IV NS 1000ML (IV SOLUTION)
IV NS 1000ML BAXH (IV SOLUTION) IMPLANT
LABEL OR SOLS (LABEL) IMPLANT
MESH 3DMAX 4X6 RT LRG (Mesh General) ×1 IMPLANT
MESH 3DMAX MID 4X6 RT LRG (Mesh General) ×1 IMPLANT
NEEDLE HYPO 22GX1.5 SAFETY (NEEDLE) ×2 IMPLANT
NEEDLE INSUFFLATION 14GA 120MM (NEEDLE) ×2 IMPLANT
OBTURATOR OPTICAL STANDARD 8MM (TROCAR) ×1
OBTURATOR OPTICAL STND 8 DVNC (TROCAR) ×1
OBTURATOR OPTICALSTD 8 DVNC (TROCAR) ×1 IMPLANT
PACK LAP CHOLECYSTECTOMY (MISCELLANEOUS) ×2 IMPLANT
PENCIL ELECTRO HAND CTR (MISCELLANEOUS) ×2 IMPLANT
SEAL CANN UNIV 5-8 DVNC XI (MISCELLANEOUS) ×3 IMPLANT
SEAL XI 5MM-8MM UNIVERSAL (MISCELLANEOUS) ×3
SET TUBE SMOKE EVAC HIGH FLOW (TUBING) ×2 IMPLANT
SOLUTION ELECTROLUBE (MISCELLANEOUS) ×2 IMPLANT
SUT MNCRL AB 4-0 PS2 18 (SUTURE) ×2 IMPLANT
SUT VIC AB 2-0 SH 27 (SUTURE) ×1
SUT VIC AB 2-0 SH 27XBRD (SUTURE) ×1 IMPLANT
SUT VLOC 90 6 CV-15 VIOLET (SUTURE) ×4 IMPLANT
SYR 30ML LL (SYRINGE) ×2 IMPLANT

## 2019-06-13 NOTE — Progress Notes (Signed)
Called Dr. Lubertha Basque and she stated she is okay with patient going home with the BP of 181/82 since he came in with a 184/101.

## 2019-06-13 NOTE — Discharge Instructions (Signed)
Hernia repair, Care After This sheet gives you information about how to care for yourself after your procedure. Your health care provider may also give you more specific instructions. If you have problems or questions, contact your health care provider. What can I expect after the procedure? After your procedure, it is common to have the following:  Pain in your abdomen, especially in the incision areas. You will be given medicine to control the pain.  Tiredness. This is a normal part of the recovery process. Your energy level will return to normal over the next several weeks.  Changes in your bowel movements, such as constipation or needing to go more often. Talk with your health care provider about how to manage this. Follow these instructions at home: Medicines   tylenolas needed for discomfort.  Please alternate between the two every four hours as needed for pain.     Use narcotics, if prescribed, only when tylenol  is not enough to control pain.   325-650mg  every 8hrs to max of 3000mg /24hrs (including the 325mg  in every norco dose) for the tylenol.     RESUME ASIPIRIN 48HRS AFTER SURGERY  PLEASE RECORD NUMBER OF PILLS TAKEN UNTIL NEXT FOLLOW UP APPT.  THIS WILL HELP DETERMINE HOW READY YOU ARE TO BE RELEASED FROM ANY ACTIVITY RESTRICTIONS  Do not drive or use heavy machinery while taking prescription pain medicine.  Do not drink alcohol while taking prescription pain medicine.  Incision care     Follow instructions from your health care provider about how to take care of your incision areas. Make sure you: ? Keep your incisions clean and dry. ? Wash your hands with soap and water before and after applying medicine to the areas, and before and after changing your bandage (dressing). If soap and water are not available, use hand sanitizer. ? Change your dressing as told by your health care provider. ? Leave stitches (sutures), skin glue, or adhesive strips in place. These skin  closures may need to stay in place for 2 weeks or longer. If adhesive strip edges start to loosen and curl up, you may trim the loose edges. Do not remove adhesive strips completely unless your health care provider tells you to do that.  Do not wear tight clothing over the incisions. Tight clothing may rub and irritate the incision areas, which may cause the incisions to open.  Do not take baths, swim, or use a hot tub until your health care provider approves. OK TO SHOWER IN 24HRS.    Check your incision area every day for signs of infection. Check for: ? More redness, swelling, or pain. ? More fluid or blood. ? Warmth. ? Pus or a bad smell. Activity  Avoid lifting anything that is heavier than 10 lb (4.5 kg) for 2 weeks or until your health care provider says it is okay.  No pushing/pulling greater than 30lbs  You may resume normal activities as told by your health care provider. Ask your health care provider what activities are safe for you.  Take rest breaks during the day as needed. Eating and drinking  Follow instructions from your health care provider about what you can eat after surgery.  To prevent or treat constipation while you are taking prescription pain medicine, your health care provider may recommend that you: ? Drink enough fluid to keep your urine clear or pale yellow. ? Take over-the-counter or prescription medicines. ? Eat foods that are high in fiber, such as fresh fruits and vegetables,  whole grains, and beans. ? Limit foods that are high in fat and processed sugars, such as fried and sweet foods. General instructions  Ask your health care provider when you will need an appointment to get your sutures or staples removed.  Keep all follow-up visits as told by your health care provider. This is important. Contact a health care provider if:  You have more redness, swelling, or pain around your incisions.  You have more fluid or blood coming from the  incisions.  Your incisions feel warm to the touch.  You have pus or a bad smell coming from your incisions or your dressing.  You have a fever.  You have an incision that breaks open (edges not staying together) after sutures or staples have been removed. Get help right away if:  You develop a rash.  You have chest pain or difficulty breathing.  You have pain or swelling in your legs.  You feel light-headed or you faint.  Your abdomen swells (becomes distended).  You have nausea or vomiting.  You have blood in your stool (feces). This information is not intended to replace advice given to you by your health care provider. Make sure you discuss any questions you have with your health care provider. Document Released: 07/15/2004 Document Revised: 09/14/2017 Document Reviewed: 09/27/2015 Elsevier Interactive Patient Education  2019 [place].  Bupivacaine Liposomal Suspension for Injection What is this medicine? BUPIVACAINE LIPOSOMAL (bue PIV a kane LIP oh som al) is an anesthetic. It causes loss of feeling in the skin or other tissues. It is used to prevent and to treat pain from some procedures. This medicine may be used for other purposes; ask your health care provider or pharmacist if you have questions. COMMON BRAND NAME(S): EXPAREL What should I tell my health care provider before I take this medicine? They need to know if you have any of these conditions:  G6PD deficiency  heart disease  kidney disease  liver disease  low blood pressure  lung or breathing disease, like asthma  an unusual or allergic reaction to bupivacaine, other medicines, foods, dyes, or preservatives  pregnant or trying to get pregnant  breast-feeding How should I use this medicine? This medicine is for injection into the affected area. It is given by a health care professional in a hospital or clinic setting. Talk to your pediatrician regarding the use of this medicine in children.  Special care may be needed. Overdosage: If you think you have taken too much of this medicine contact a poison control center or emergency room at once. NOTE: This medicine is only for you. Do not share this medicine with others. What if I miss a dose? This does not apply. What may interact with this medicine? This medicine may interact with the following medications:  acetaminophen  certain antibiotics like dapsone, nitrofurantoin, aminosalicylic acid, sulfonamides  certain medicines for seizures like phenobarbital, phenytoin, valproic acid  chloroquine  cyclophosphamide  flutamide  hydroxyurea  ifosfamide  metoclopramide  nitric oxide  nitroglycerin  nitroprusside  nitrous oxide  other local anesthetics like lidocaine, pramoxine, tetracaine  primaquine  quinine  rasburicase  sulfasalazine This list may not describe all possible interactions. Give your health care provider a list of all the medicines, herbs, non-prescription drugs, or dietary supplements you use. Also tell them if you smoke, drink alcohol, or use illegal drugs. Some items may interact with your medicine. What should I watch for while using this medicine? Your condition will be monitored carefully while  you are receiving this medicine. Be careful to avoid injury while the area is numb, and you are not aware of pain. What side effects may I notice from receiving this medicine? Side effects that you should report to your doctor or health care professional as soon as possible:  allergic reactions like skin rash, itching or hives, swelling of the face, lips, or tongue  seizures  signs and symptoms of a dangerous change in heartbeat or heart rhythm like chest pain; dizziness; fast, irregular heartbeat; palpitations; feeling faint or lightheaded; falls; breathing problems  signs and symptoms of methemoglobinemia such as pale, gray, or blue colored skin; headache; fast heartbeat; shortness of breath;  feeling faint or lightheaded, falls; tiredness Side effects that usually do not require medical attention (report to your doctor or health care professional if they continue or are bothersome):  anxious  back pain  changes in taste  changes in vision  constipation  dizziness  fever  nausea, vomiting This list may not describe all possible side effects. Call your doctor for medical advice about side effects. You may report side effects to FDA at 1-800-FDA-1088. Where should I keep my medicine? This drug is given in a hospital or clinic and will not be stored at home. NOTE: This sheet is a summary. It may not cover all possible information. If you have questions about this medicine, talk to your doctor, pharmacist, or health care provider.  2020 Elsevier/Gold Standard (2018-10-08 10:48:23)  AMBULATORY SURGERY  DISCHARGE INSTRUCTIONS   1) The drugs that you were given will stay in your system until tomorrow so for the next 24 hours you should not:  A) Drive an automobile B) Make any legal decisions C) Drink any alcoholic beverage   2) You may resume regular meals tomorrow.  Today it is better to start with liquids and gradually work up to solid foods.  You may eat anything you prefer, but it is better to start with liquids, then soup and crackers, and gradually work up to solid foods.   3) Please notify your doctor immediately if you have any unusual bleeding, trouble breathing, redness and pain at the surgery site, drainage, fever, or pain not relieved by medication.    4) Additional Instructions:        Please contact your physician with any problems or Same Day Surgery at [phone], Monday through Friday 6 am to 4 pm, or [place] at [facility] number at [phone].

## 2019-06-13 NOTE — Anesthesia Procedure Notes (Addendum)
Procedure Name: Intubation Date/Time: 06/13/2019 8:02 AM Performed by: Babs Sciara, CRNA Pre-anesthesia Checklist: Patient identified, Emergency Drugs available, Suction available, Patient being monitored and Timeout performed Patient Re-evaluated:Patient Re-evaluated prior to induction Oxygen Delivery Method: Circle system utilized Preoxygenation: Pre-oxygenation with 100% oxygen Induction Type: IV induction Ventilation: Mask ventilation without difficulty and Oral airway inserted - appropriate to patient size Laryngoscope Size: McGraph and 4 Grade View: Grade I Tube type: Oral Tube size: 7.5 mm Number of attempts: 2 (ist attempt with Mac 4, inadequate view) Airway Equipment and Method: Stylet Placement Confirmation: positive ETCO2 and breath sounds checked- equal and bilateral Secured at: 21 (lip) cm Tube secured with: Tape Dental Injury: Teeth and Oropharynx as per pre-operative assessment

## 2019-06-13 NOTE — Op Note (Signed)
Preoperative diagnosis: Right inguinal Hernia, reducible  Postoperative diagnosis: Right indirect inguinal Hernia  Procedure: Robotic assisted laparoscopic Right indirect inguinal hernia repair with mesh  Anesthesia: General  Surgeon: Dr. Lysle Pearl  Wound Classification: Clean  Specimen: none  Complications: None  Estimated Blood Loss: 53mL   Indications:  inguinal hernia. Repair was indicated to avoid complications of incarceration, obstruction and pain, and a prosthetic mesh repair was elected.  See H&P for further details.  Findings: 1. Vas Deferens and cord structures identified and preserved 2. Bard 3D max medium weight mesh used for repair 3. Adequate hemostasis achieved  Description of procedure: The patient was taken to the operating room. A time-out was completed verifying correct patient, procedure, site, positioning, and implant(s) and/or special equipment prior to beginning this procedure.  Area was prepped and draped in the usual sterile fashion. An incision was marked 20 cm above the pubic tubercle, slightly above the umbilicus  Scrotum wrapped in Kerlix roll.  Incision was made at the previously marked site after injecting local anesthesia. Veress needle inserted at palmer's point.  Saline drop test noted to be positive with gradual increase in pressure after initiation of gas insufflation.  15 mm of pressure was achieved prior to removing the Veress needle and then placing a 8 mm port via the Optiview technique through the supraumbilical site.  Inspection of the area afterwards noted no injury to the surrounding organs during insertion of the needle and the port.  2 port sites were marked 8 cm to the lateral sides of the initial port, and a 8 mm robotic port was placed on the left side, another 8 mm robotic port on the right side under direct supervision.  Local anesthesia  infused to the preplanned incision site prior to insertion of the port.  The [place] was  then brought into the operative field and docked to the ports.  Examination of the abdominal cavity noted a Right indirect inguinal hernia.  A peritoneal flap was created approximately 8cm cephalad to the defect by using scissors with electrocautery.  Dissection was carried down towards the pubic tubercle, developing the myopectineal orifice view.  Laterally the flap was carried towards the ASIS.  Moderate size hernia sac and adjacent lipoma was noted, which carefully dissected away from the adjacent tissues to be fully reduced out of hernia cavity.  Any bleeding was controlled with combination of electrocautery and manual pressure.    After confirming adequate dissection and the peritoneal reflection completely down and away from the cord structures, a Large Bard 3DMax medium weight mesh was placed within the anterior abdominal wall, secured in place using 2-0 Vicryl on an SH needle immediately above the pubic tubercle.  After noting proper placement of the mesh with the peritoneal reflection deep to it, the previously created peritoneal flap was secured back up to the anterior abdominal wall using running 3-0 V-Lock.  Both needles were then removed out of the abdominal cavity, Xi platform undocked from the ports and removed off of operative field.  exparel infused as ilioinguinal block.  Abdomen then desufflated and ports removed. All the skin incisions were then closed with a subcuticular stitch of Monocryl 4-0. Dermabond was applied. The testis was gently pulled down into its anatomic position in the scrotum.  The patient tolerated the procedure well and was taken to the postanesthesia care unit in stable condition. Sponge and instrument count correct at end of procedure.

## 2019-06-13 NOTE — Interval H&P Note (Signed)
History and Physical Interval Note:  06/13/2019 7:36 AM  Terry Sheppard  has presented today for surgery, with the diagnosis of K40.90 Non recurrent unilateral inguinal hernia without obstruction or gangrene.  The various methods of treatment have been discussed with the patient and family. After consideration of risks, benefits and other options for treatment, the patient has consented to  Procedure(s): XI ROBOTIC [place] (Right) as a surgical intervention.  The patient's history has been reviewed, patient examined, no change in status, stable for surgery.  I have reviewed the patient's chart and labs.  Questions were answered to the patient's satisfaction.     Landon Truax Lysle Pearl

## 2019-06-13 NOTE — Anesthesia Preprocedure Evaluation (Addendum)
Anesthesia Evaluation  Patient identified by MRN, date of birth, ID band Patient awake    Reviewed: Allergy & Precautions, H&P , NPO status , Patient's Chart, lab work & pertinent test results  Airway Mallampati: II  TM Distance: >3 FB     Dental  (+) Edentulous Upper   Pulmonary shortness of breath and with exertion, neg sleep apnea, COPD, neg recent URI, Not current smoker, former smoker,    breath sounds clear to auscultation       Cardiovascular hypertension, (-) angina(-) Past MI (-) dysrhythmias  Rhythm:regular Rate:Normal     Neuro/Psych negative neurological ROS  negative psych ROS   GI/Hepatic Neg liver ROS, GERD  ,  Endo/Other  negative endocrine ROS  Renal/GU      Musculoskeletal   Abdominal   Peds  Hematology negative hematology ROS (+)   Anesthesia Other Findings Past Medical History: No date: Arthritis     Comment:  knee and hands No date: COPD (chronic obstructive pulmonary disease) (HCC)     Comment:  no inhalers No date: GERD (gastroesophageal reflux disease)     Comment:  occ No date: High cholesterol No date: Hypertension  Past Surgical History: No date: WISDOM TOOTH EXTRACTION  BMI    Body Mass Index: 25.52 kg/m      Reproductive/Obstetrics negative OB ROS                            Anesthesia Physical Anesthesia Plan  ASA: II  Anesthesia Plan: General ETT   Post-op Pain Management:    Induction:   PONV Risk Score and Plan: Ondansetron, Dexamethasone and Treatment may vary due to age or medical condition  Airway Management Planned:   Additional Equipment:   Intra-op Plan:   Post-operative Plan:   Informed Consent: I have reviewed the patients History and Physical, chart, labs and discussed the procedure including the risks, benefits and alternatives for the proposed anesthesia with the patient or authorized representative who has indicated  his/her understanding and acceptance.     Dental Advisory Given  Plan Discussed with: Anesthesiologist, CRNA and Surgeon  Anesthesia Plan Comments:         Anesthesia Quick Evaluation

## 2019-06-13 NOTE — Transfer of Care (Signed)
Immediate Anesthesia Transfer of Care Note  Patient: Terry Sheppard  Procedure(s) Performed: XI ROBOTIC ASSISTED INGUINAL HERNIA REPAIR WITH MESH (Right Abdomen)  Patient Location: PACU  Anesthesia Type:General  Level of Consciousness: awake and oriented  Airway & Oxygen Therapy: Patient Spontanous Breathing and Patient connected to face mask oxygen  Post-op Assessment: Report given to RN and Post -op Vital signs reviewed and stable  Post vital signs: Reviewed and stable  Last Vitals:  Vitals Value Taken Time  BP 193/97 06/13/19 0952  Temp 36.1 C 06/13/19 0951  Pulse 53 06/13/19 0959  Resp 17 06/13/19 0959  SpO2 100 % 06/13/19 0959  Vitals shown include unvalidated device data.  Last Pain:  Vitals:   06/13/19 0637  TempSrc: Temporal  PainSc: 0-No pain         Complications: No apparent anesthesia complications

## 2019-06-14 NOTE — Anesthesia Postprocedure Evaluation (Signed)
Anesthesia Post Note  Patient: Terry Sheppard  Procedure(s) Performed: XI ROBOTIC ASSISTED INGUINAL HERNIA REPAIR WITH MESH (Right Abdomen)  Patient location during evaluation: PACU Anesthesia Type: General Level of consciousness: awake and alert Pain management: pain level controlled Vital Signs Assessment: post-procedure vital signs reviewed and stable Respiratory status: spontaneous breathing, nonlabored ventilation and respiratory function stable Cardiovascular status: blood pressure returned to baseline and stable Postop Assessment: no apparent nausea or vomiting Anesthetic complications: no     Last Vitals:  Vitals:   06/13/19 1133 06/13/19 1202  BP: (!) 181/82 (!) 189/86  Pulse: (!) 51 (!) 56  Resp: 16 14  Temp:  (!) 36.1 C  SpO2: 97% 97%    Last Pain:  Vitals:   06/13/19 1202  TempSrc: Temporal  PainSc:                  Tera Mater

## 2019-09-28 LAB — COLOGUARD: COLOGUARD: NEGATIVE

## 2019-09-28 LAB — EXTERNAL GENERIC LAB PROCEDURE: COLOGUARD: NEGATIVE

## 2019-12-15 ENCOUNTER — Other Ambulatory Visit: Payer: Self-pay | Admitting: Surgery

## 2019-12-15 DIAGNOSIS — R197 Diarrhea, unspecified: Secondary | ICD-10-CM

## 2019-12-15 DIAGNOSIS — R1031 Right lower quadrant pain: Secondary | ICD-10-CM

## 2019-12-23 ENCOUNTER — Other Ambulatory Visit: Payer: Self-pay

## 2019-12-23 ENCOUNTER — Ambulatory Visit
Admission: RE | Admit: 2019-12-23 | Discharge: 2019-12-23 | Disposition: A | Discharge status: Home / Self Care | Payer: Medicare Other | Source: Ambulatory Visit | Attending: Surgery | Admitting: Surgery

## 2019-12-23 DIAGNOSIS — R197 Diarrhea, unspecified: Secondary | ICD-10-CM

## 2019-12-23 DIAGNOSIS — R1031 Right lower quadrant pain: Secondary | ICD-10-CM | POA: Diagnosis present

## 2019-12-23 MED ORDER — IOHEXOL 300 MG/ML  SOLN
100.0000 mL | Freq: Once | INTRAMUSCULAR | Status: AC | PRN
  Administered 2019-12-23: 100 mL via INTRAVENOUS

## 2019-12-24 ENCOUNTER — Other Ambulatory Visit: Payer: Self-pay | Admitting: Surgery

## 2019-12-24 DIAGNOSIS — K869 Disease of pancreas, unspecified: Secondary | ICD-10-CM

## 2020-01-05 ENCOUNTER — Other Ambulatory Visit: Payer: Self-pay

## 2020-01-05 ENCOUNTER — Ambulatory Visit
Admission: RE | Admit: 2020-01-05 | Discharge: 2020-01-05 | Disposition: A | Discharge status: Home / Self Care | Payer: Medicare Other | Source: Ambulatory Visit | Attending: Surgery | Admitting: Surgery

## 2020-01-05 DIAGNOSIS — K869 Disease of pancreas, unspecified: Secondary | ICD-10-CM | POA: Diagnosis present

## 2020-01-05 MED ORDER — GADOBUTROL 1 MMOL/ML IV SOLN
7.5000 mL | Freq: Once | INTRAVENOUS | Status: AC | PRN
  Administered 2020-01-05: 7.5 mL via INTRAVENOUS

## 2020-01-06 DIAGNOSIS — D490 Neoplasm of unspecified behavior of digestive system: Secondary | ICD-10-CM | POA: Insufficient documentation

## 2020-02-09 DIAGNOSIS — K5730 Diverticulosis of large intestine without perforation or abscess without bleeding: Secondary | ICD-10-CM | POA: Insufficient documentation

## 2020-03-11 ENCOUNTER — Other Ambulatory Visit: Payer: Self-pay

## 2020-03-11 ENCOUNTER — Encounter: Payer: Self-pay | Admitting: Dietician

## 2020-03-11 ENCOUNTER — Encounter: Payer: Medicare Other | Attending: Student | Admitting: Dietician

## 2020-03-11 VITALS — Ht 71.0 in | Wt 182.0 lb

## 2020-03-11 DIAGNOSIS — E119 Type 2 diabetes mellitus without complications: Secondary | ICD-10-CM | POA: Diagnosis present

## 2020-03-11 DIAGNOSIS — I10 Essential (primary) hypertension: Secondary | ICD-10-CM | POA: Insufficient documentation

## 2020-03-11 DIAGNOSIS — R7303 Prediabetes: Secondary | ICD-10-CM

## 2020-03-11 DIAGNOSIS — E782 Mixed hyperlipidemia: Secondary | ICD-10-CM | POA: Insufficient documentation

## 2020-03-11 DIAGNOSIS — E785 Hyperlipidemia, unspecified: Secondary | ICD-10-CM

## 2020-03-11 NOTE — Patient Instructions (Signed)
   Plan to eat something every 3-4 hours during the day. Small meals and snacks often are easier on the digestive system.   When eating a meal, include a protein food + small- moderate amount of a starch (fist size or less) + a soft veg or fruit. Limit any added fat like butter, mayo, or salad dressing.  Choose low fiber and low fat foods to allow better healing in the GI tract.

## 2020-03-11 NOTE — Progress Notes (Signed)
Medical Nutrition Therapy: Visit start time: 1500  end time: 1600  Assessment:  Diagnosis: Type 2 diabetes, hyperlipidemia, HTN Past medical history: diverticulitis Psychosocial issues/ stress concerns: high stress level caring for sister with alzheimer's disease  Preferred learning method:  . Auditory . Visual  Current weight: 182.0lbs Height: 5'11" BMI: 25.38 Medications, supplements: reconciled list in medical record  Progress and evaluation:   Patient reports recently having frequent episodes of abdominal pain/ soreness and watery diarrhea. He was diagnosed with diverticulitis; will be having colonoscopy next week.   His GI symptoms are his main concern today.   He staes HbA1C has fluctuated for multiple years. Recent HbA1C was 6.2%.  Cholesterol currently in control with pravastatin-- total cholesterol 160, HDL 58.5, LDL 85.   Physical activity: no structured activity   Dietary Intake:  Usual eating pattern includes 2 meals and 0 snacks per day. Dining out frequency: 3-4 meals per week.  Breakfast: none Snack: none Lunch: 03/11/20, potato salad, chicken salad, tomato, boiled egg; bbq; ham and cheese sandwich; leftovers Snack: none Supper: 03/10/20-- chili; chicken in crock pot + veg ie broccoli, cauliflower steamed Snack: none Beverages: water, coffee in am; occ tea; rum and coke at night  Nutrition Care Education: Topics covered:  Basic nutrition: basic food groups, appropriate nutrient balance, appropriate meal and snack schedule, general nutrition guidelines    Diabetes prevention:  appropriate meal and snack schedule, appropriate carb intake and balance, role of protein Diverticulitis: low fiber, low fat eating pattern; protein sources with all meals; eating small, frequent meals and snacks rather than larger, more infrequent meals; limiting caffeine and alcohol   Nutritional Diagnosis:  [place]s-2.2 Altered nutrition-related laboratory As related to pre-diabetes,  hyperlipidemia.  As evidenced by elevated HbA1C and history of elevated lipids. [place]-1.4 Altered GI function As related to diverticulitis.  As evidenced by patient report of abdominal pain and diarrhea.  Intervention:  . Instruction and discussion as noted above. . Patient voices readiness to work on dietary changes to promote gut health and prevent diabetes.  . Established goals for change with input from patient.  . No follow-up scheduled at this time; patient will call after he has colonoscopy results.  Education Materials given:  Marland Kitchen Low Fiber Nutrition Therapy (NCM) . Plate Planner with sample meal pattern . Visit summary with goals/ instructions   Learner/ who was taught:  . Patient  . Spouse/ partner  Level of understanding: Marland Kitchen Verbalizes/ demonstrates competency   Demonstrated degree of understanding via:   Teach back Learning barriers: . None  Willingness to learn/ readiness for change: . Acceptance, ready for change   Monitoring and Evaluation:  Dietary intake, exercise, BG and blood lipids, HTN, GI symptoms, and body weight      follow up: prn

## 2020-05-25 ENCOUNTER — Other Ambulatory Visit (HOSPITAL_COMMUNITY): Payer: Self-pay | Admitting: Surgery

## 2020-05-25 ENCOUNTER — Other Ambulatory Visit: Payer: Self-pay | Admitting: Surgery

## 2020-05-25 DIAGNOSIS — D490 Neoplasm of unspecified behavior of digestive system: Secondary | ICD-10-CM

## 2020-07-06 ENCOUNTER — Ambulatory Visit: Payer: Medicare Other

## 2020-08-05 ENCOUNTER — Ambulatory Visit
Admission: RE | Admit: 2020-08-05 | Discharge: 2020-08-05 | Disposition: A | Discharge status: Home / Self Care | Payer: Medicare Other | Source: Ambulatory Visit | Attending: Surgery | Admitting: Surgery

## 2020-08-05 ENCOUNTER — Other Ambulatory Visit: Payer: Self-pay

## 2020-08-05 DIAGNOSIS — D490 Neoplasm of unspecified behavior of digestive system: Secondary | ICD-10-CM

## 2020-08-05 MED ORDER — GADOBUTROL 1 MMOL/ML IV SOLN
8.0000 mL | Freq: Once | INTRAVENOUS | Status: AC | PRN
  Administered 2020-08-05: 8 mL via INTRAVENOUS

## 2021-02-01 ENCOUNTER — Other Ambulatory Visit: Payer: Self-pay | Admitting: Surgery

## 2021-02-01 ENCOUNTER — Ambulatory Visit: Payer: Self-pay | Admitting: Surgery

## 2021-02-01 DIAGNOSIS — K4091 Unilateral inguinal hernia, without obstruction or gangrene, recurrent: Secondary | ICD-10-CM

## 2021-02-01 DIAGNOSIS — D490 Neoplasm of unspecified behavior of digestive system: Secondary | ICD-10-CM

## 2021-02-01 NOTE — H&P (Signed)
Subjective:  CC: Unilateral recurrent inguinal hernia without obstruction or gangrene [K40.91]  HPI:  Terry Sheppard is a 76 y.o. male who returns for evaluation of above. Symptoms were first noted 2 months ago. Pain is intermittent and discomfort, confined to the right groin, without radiation.  Associated with increased bloating and diarrhea, exacerbated by certain foods?  Lump is reducible.    Past Medical History:  has a past medical history of Arthritis, COPD (chronic obstructive pulmonary disease) (CMS-HCC), Former tobacco use, GERD (gastroesophageal reflux disease), HLD (hyperlipidemia), PNA (pneumonia), and Right wrist fracture.  Past Surgical History:  Past Surgical History: Procedure Laterality Date  LAPAROSCOPIC INGUINAL HERNIA REPAIR Right 06/2019  Robotic by Levan Aloia  COLONOSCOPY  03/17/2020  Diverticulosis/Otherwise normal colon/Repeat 43yrs/Ivyana Locey  TONSILLECTOMY     Family History: family history includes Breast cancer (age of onset: 41) in his mother; Fibrocystic breast disease in his daughter; No Known Problems in his daughter, sister, sister, son, and son; Pancreatic cancer in his maternal grandfather; Skin cancer in his father.  Social History:  reports that he quit smoking about 24 years ago. His smoking use included cigarettes. He started smoking about 64 years ago. He smoked an average of 2 packs per day. He has never used smokeless tobacco. He reports current alcohol use. He reports that he does not use drugs.  Current Medications: has a current medication list which includes the following prescription(s): aspirin, citalopram, losartan-hydrochlorothiazide, meloxicam, omega 3-dha-epa-fish oil, pravastatin, and sildenafil.  Allergies:  Allergies as of 02/01/2021 - Reviewed 02/01/2021 Allergen Reaction Noted  Lisinopril Cough 10/13/2015   ROS:  A 15 point review of systems was performed and pertinent positives and negatives noted in HPI   Objective:    BP (!)  177/89    Pulse 55    Ht 173 cm (5' 8.11")    Wt 82.1 kg (181 lb)    BMI 27.43 kg/m   Constitutional :  Alert, cooperative, no distress Lymphatics/Throat:  Supple, no lymphadenopathy Respiratory:  clear to auscultation bilaterally Cardiovascular:  regular rate and rhythm Gastrointestinal: soft, non-tender; bowel sounds normal; no masses,  no organomegaly. inguinal hernia noted.  small, reducible, no overlying skin changes and RIGHT AND RECURRENT Musculoskeletal: Steady gait and movement Skin: Cool and moist, lap surgical scars Psychiatric: Normal affect, non-agitated, not confused     LABS:  n/a   RADS: n/a Assessment:      Unilateral recurrent inguinal hernia without obstruction or gangrene [K40.91]  S/p robotic right inguinal hernia repair with mesh.  Diarrhea is only episodic so likely not related.  IPMN  Plan:    1. Unilateral recurrent inguinal hernia without obstruction or gangrene [K40.91]   Discussed the risk of surgery including recurrence, which can be up to 50% in the case of incisional or complex hernias, possible use of prosthetic materials (mesh) and the increased risk of mesh infxn if used, bleeding, chronic pain, post-op infxn, post-op SBO or ileus, and possible re-operation to address said risks. The risks of general anesthetic, if used, includes MI, CVA, sudden death or even reaction to anesthetic medications also discussed. Alternatives include continued observation.  Benefits include possible symptom relief, prevention of incarceration, strangulation, enlargement in size over time, and the risk of emergency surgery in the face of strangulation.   Typical post-op recovery time of 3-5 days with 2 weeks of activity restrictions were also discussed.  ED return precautions given for sudden increase in pain, size of hernia with accompanying fever, nausea, and/or vomiting.  The  patient verbalized understanding and all questions were answered to the patient's  satisfaction.   2. Patient has elected to proceed with surgical treatment. Procedure will be scheduled. RIGHT, robotic assisted laparoscopic  Also due for surveillance MRI for incidental IPMN.  Will schedule as well.

## 2021-02-01 NOTE — H&P (View-Only) (Signed)
Subjective:  CC: Unilateral recurrent inguinal hernia without obstruction or gangrene [K40.91]  HPI:  Terry Sheppard is a 76 y.o. male who returns for evaluation of above. Symptoms were first noted 2 months ago. Pain is intermittent and discomfort, confined to the right groin, without radiation.  Associated with increased bloating and diarrhea, exacerbated by certain foods?  Lump is reducible.    Past Medical History:  has a past medical history of Arthritis, COPD (chronic obstructive pulmonary disease) (CMS-HCC), Former tobacco use, GERD (gastroesophageal reflux disease), HLD (hyperlipidemia), PNA (pneumonia), and Right wrist fracture.  Past Surgical History:  Past Surgical History: Procedure Laterality Date  LAPAROSCOPIC INGUINAL HERNIA REPAIR Right 06/2019  Robotic by Sarann Tregre  COLONOSCOPY  03/17/2020  Diverticulosis/Otherwise normal colon/Repeat 106yrs/Terrel Manalo  TONSILLECTOMY     Family History: family history includes Breast cancer (age of onset: 7) in his mother; Fibrocystic breast disease in his daughter; No Known Problems in his daughter, sister, sister, son, and son; Pancreatic cancer in his maternal grandfather; Skin cancer in his father.  Social History:  reports that he quit smoking about 24 years ago. His smoking use included cigarettes. He started smoking about 64 years ago. He smoked an average of 2 packs per day. He has never used smokeless tobacco. He reports current alcohol use. He reports that he does not use drugs.  Current Medications: has a current medication list which includes the following prescription(s): aspirin, citalopram, losartan-hydrochlorothiazide, meloxicam, omega 3-dha-epa-fish oil, pravastatin, and sildenafil.  Allergies:  Allergies as of 02/01/2021 - Reviewed 02/01/2021 Allergen Reaction Noted  Lisinopril Cough 10/13/2015   ROS:  A 15 point review of systems was performed and pertinent positives and negatives noted in HPI   Objective:    BP (!)  177/89    Pulse 55    Ht 173 cm (5' 8.11")    Wt 82.1 kg (181 lb)    BMI 27.43 kg/m   Constitutional :  Alert, cooperative, no distress Lymphatics/Throat:  Supple, no lymphadenopathy Respiratory:  clear to auscultation bilaterally Cardiovascular:  regular rate and rhythm Gastrointestinal: soft, non-tender; bowel sounds normal; no masses,  no organomegaly. inguinal hernia noted.  small, reducible, no overlying skin changes and RIGHT AND RECURRENT Musculoskeletal: Steady gait and movement Skin: Cool and moist, lap surgical scars Psychiatric: Normal affect, non-agitated, not confused     LABS:  n/a   RADS: n/a Assessment:      Unilateral recurrent inguinal hernia without obstruction or gangrene [K40.91]  S/p robotic right inguinal hernia repair with mesh.  Diarrhea is only episodic so likely not related.  IPMN  Plan:    1. Unilateral recurrent inguinal hernia without obstruction or gangrene [K40.91]   Discussed the risk of surgery including recurrence, which can be up to 50% in the case of incisional or complex hernias, possible use of prosthetic materials (mesh) and the increased risk of mesh infxn if used, bleeding, chronic pain, post-op infxn, post-op SBO or ileus, and possible re-operation to address said risks. The risks of general anesthetic, if used, includes MI, CVA, sudden death or even reaction to anesthetic medications also discussed. Alternatives include continued observation.  Benefits include possible symptom relief, prevention of incarceration, strangulation, enlargement in size over time, and the risk of emergency surgery in the face of strangulation.   Typical post-op recovery time of 3-5 days with 2 weeks of activity restrictions were also discussed.  ED return precautions given for sudden increase in pain, size of hernia with accompanying fever, nausea, and/or vomiting.  The  patient verbalized understanding and all questions were answered to the patient's  satisfaction.   2. Patient has elected to proceed with surgical treatment. Procedure will be scheduled. RIGHT, robotic assisted laparoscopic  Also due for surveillance MRI for incidental IPMN.  Will schedule as well.

## 2021-02-10 ENCOUNTER — Inpatient Hospital Stay: Admission: RE | Admit: 2021-02-10 | Payer: Medicare Other | Source: Ambulatory Visit

## 2021-02-10 ENCOUNTER — Other Ambulatory Visit: Payer: Self-pay

## 2021-02-10 ENCOUNTER — Encounter
Admission: RE | Admit: 2021-02-10 | Discharge: 2021-02-10 | Disposition: A | Discharge status: Home / Self Care | Payer: Medicare Other | Source: Ambulatory Visit | Attending: Surgery | Admitting: Surgery

## 2021-02-10 HISTORY — DX: Atherosclerosis of aorta: I70.0

## 2021-02-10 NOTE — Patient Instructions (Signed)
Your procedure is scheduled on:02-18-21 Friday Report to the Registration Desk on the 1st floor of the [facility].Then proceed to the 2nd floor Surgery Desk in the [facility] To find out your arrival time, please call [phone] between 1PM - 3PM on:02-17-21 Thursday  REMEMBER: Instructions that are not followed completely may result in serious medical risk, up to and including death; or upon the discretion of your surgeon and anesthesiologist your surgery may need to be rescheduled.  Do not eat food after midnight the night before surgery.  No gum chewing, lozengers or hard candies.  You may however, drink CLEAR liquids up to 2 hours before you are scheduled to arrive for your surgery. Do not drink anything within 2 hours of your scheduled arrival time.  Clear liquids include: - water  - apple juice without pulp - gatorade (not RED, PURPLE, OR BLUE) - black coffee or tea (Do NOT add milk or creamers to the coffee or tea) Do NOT drink anything that is not on this list.  TAKE THESE MEDICATIONS THE MORNING OF SURGERY WITH A SIP OF WATER: -citalopram (CELEXA)  -pravastatin (PRAVACHOL)  One week prior to surgery: Stop Anti-inflammatories (NSAIDS) such as Advil, Aleve, Ibuprofen, Motrin, Naproxen, Naprosyn and Aspirin based products such as Excedrin, Goodys Powder, BC Powder.You may however,take Tylenol if needed for pain up until the day of surgery.  Stop ANY OVER THE COUNTER supplements/vitamins NOW (02-10-21) until after surgery.  No Alcohol for 24 hours before or after surgery.  No Smoking including e-cigarettes for 24 hours prior to surgery.  No chewable tobacco products for at least 6 hours prior to surgery.  No nicotine patches on the day of surgery.  Do not use any "recreational" drugs for at least a week prior to your surgery.  Please be advised that the combination of cocaine and anesthesia may have negative outcomes, up to and including death. If you test positive for  cocaine, your surgery will be cancelled.  On the morning of surgery brush your teeth with toothpaste and water, you may rinse your mouth with mouthwash if you wish. Do not swallow any toothpaste or mouthwash.  Use CHG Soap as directed on instruction sheet.  Do not wear jewelry, make-up, hairpins, clips or nail polish.  Do not wear lotions, powders, or perfumes.   Do not shave body from the neck down 48 hours prior to surgery just in case you cut yourself which could leave a site for infection.  Also, freshly shaved skin may become irritated if using the CHG soap.  Contact lenses, hearing aids and dentures may not be worn into surgery.  Do not bring valuables to the hospital. [facility] is not responsible for any missing/lost belongings or valuables.   Notify your doctor if there is any change in your medical condition (cold, fever, infection).  Wear comfortable clothing (specific to your surgery type) to the hospital.  After surgery, you can help prevent lung complications by doing breathing exercises.  Take deep breaths and cough every 1-2 hours. Your doctor may order a device called an Incentive Spirometer to help you take deep breaths. When coughing or sneezing, hold a pillow firmly against your incision with both hands. This is called splinting. Doing this helps protect your incision. It also decreases belly discomfort.  If you are being admitted to the hospital overnight, leave your suitcase in the car. After surgery it may be brought to your room.  If you are being discharged the day of  surgery, you will not be allowed to drive home. You will need a responsible adult (18 years or older) to drive you home and stay with you that night.   If you are taking public transportation, you will need to have a responsible adult (18 years or older) with you. Please confirm with your physician that it is acceptable to use public transportation.   Please call the [facility]  Dept. at [phone] if you have any questions about these instructions.  Surgery Visitation Policy:  Patients undergoing a surgery or procedure may have one family member or support person with them as long as that person is not COVID-19 positive or experiencing its symptoms.  That person may remain in the waiting area during the procedure and may rotate out with other people.  Inpatient Visitation:    Visiting hours are 7 a.m. to 8 p.m. Up to two visitors ages 16+ are allowed at one time in a patient room. The visitors may rotate out with other people during the day. Visitors must check out when they leave, or other visitors will not be allowed. One designated support person may remain overnight. The visitor must pass COVID-19 screenings, use hand sanitizer when entering and exiting the patients room and wear a mask at all times, including in the patients room. Patients must also wear a mask when staff or their visitor are in the room. Masking is required regardless of vaccination status.

## 2021-02-11 ENCOUNTER — Encounter
Admission: RE | Admit: 2021-02-11 | Discharge: 2021-02-11 | Disposition: A | Discharge status: Home / Self Care | Payer: Medicare Other | Source: Ambulatory Visit | Attending: Surgery | Admitting: Surgery

## 2021-02-11 DIAGNOSIS — Z01810 Encounter for preprocedural cardiovascular examination: Secondary | ICD-10-CM | POA: Insufficient documentation

## 2021-02-11 DIAGNOSIS — I10 Essential (primary) hypertension: Secondary | ICD-10-CM | POA: Insufficient documentation

## 2021-02-17 ENCOUNTER — Ambulatory Visit
Admission: RE | Admit: 2021-02-17 | Discharge: 2021-02-17 | Disposition: A | Discharge status: Home / Self Care | Payer: Medicare Other | Source: Ambulatory Visit | Attending: Surgery | Admitting: Surgery

## 2021-02-17 DIAGNOSIS — D490 Neoplasm of unspecified behavior of digestive system: Secondary | ICD-10-CM | POA: Insufficient documentation

## 2021-02-17 DIAGNOSIS — K4091 Unilateral inguinal hernia, without obstruction or gangrene, recurrent: Secondary | ICD-10-CM | POA: Insufficient documentation

## 2021-02-17 MED ORDER — GADOBUTROL 1 MMOL/ML IV SOLN
7.5000 mL | Freq: Once | INTRAVENOUS | Status: AC | PRN
  Administered 2021-02-17: 7.5 mL via INTRAVENOUS

## 2021-02-18 ENCOUNTER — Encounter: Payer: Self-pay | Admitting: Surgery

## 2021-02-18 ENCOUNTER — Encounter
Admission: RE | Disposition: A | Discharge status: Home / Self Care | Payer: Self-pay | Source: Home / Self Care | Attending: Surgery

## 2021-02-18 ENCOUNTER — Other Ambulatory Visit: Payer: Self-pay

## 2021-02-18 ENCOUNTER — Ambulatory Visit: Payer: Medicare Other | Admitting: Anesthesiology

## 2021-02-18 ENCOUNTER — Ambulatory Visit: Payer: Medicare Other | Admitting: Urgent Care

## 2021-02-18 ENCOUNTER — Ambulatory Visit
Admission: RE | Admit: 2021-02-18 | Discharge: 2021-02-18 | Disposition: A | Discharge status: Home / Self Care | Payer: Medicare Other | Attending: Surgery | Admitting: Surgery

## 2021-02-18 DIAGNOSIS — I700 Atherosclerosis of aorta: Secondary | ICD-10-CM | POA: Insufficient documentation

## 2021-02-18 DIAGNOSIS — F32A Depression, unspecified: Secondary | ICD-10-CM | POA: Insufficient documentation

## 2021-02-18 DIAGNOSIS — Z87891 Personal history of nicotine dependence: Secondary | ICD-10-CM | POA: Insufficient documentation

## 2021-02-18 DIAGNOSIS — I10 Essential (primary) hypertension: Secondary | ICD-10-CM | POA: Insufficient documentation

## 2021-02-18 DIAGNOSIS — J449 Chronic obstructive pulmonary disease, unspecified: Secondary | ICD-10-CM | POA: Insufficient documentation

## 2021-02-18 DIAGNOSIS — K4091 Unilateral inguinal hernia, without obstruction or gangrene, recurrent: Secondary | ICD-10-CM | POA: Diagnosis not present

## 2021-02-18 DIAGNOSIS — K219 Gastro-esophageal reflux disease without esophagitis: Secondary | ICD-10-CM | POA: Insufficient documentation

## 2021-02-18 HISTORY — PX: INGUINAL HERNIA REPAIR: SHX194

## 2021-02-18 SURGERY — REPAIR, HERNIA, INGUINAL, ADULT
Anesthesia: General | Site: Groin | Laterality: Right

## 2021-02-18 MED ORDER — FAMOTIDINE 20 MG PO TABS: 20.0000 mg | ORAL_TABLET | Freq: Once | ORAL | Status: AC

## 2021-02-18 MED ORDER — KETOROLAC TROMETHAMINE 30 MG/ML IJ SOLN
INTRAMUSCULAR | Status: DC | PRN
  Administered 2021-02-18: 30 mg via INTRAVENOUS

## 2021-02-18 MED ORDER — CEFAZOLIN SODIUM-DEXTROSE 2-4 GM/100ML-% IV SOLN
2.0000 g | INTRAVENOUS | Status: AC
  Administered 2021-02-18: 2 g via INTRAVENOUS

## 2021-02-18 MED ORDER — DEXAMETHASONE SODIUM PHOSPHATE 10 MG/ML IJ SOLN
INTRAMUSCULAR | Status: DC | PRN
  Administered 2021-02-18: 10 mg via INTRAVENOUS

## 2021-02-18 MED ORDER — FAMOTIDINE 20 MG PO TABS
ORAL_TABLET | ORAL | Status: AC
  Administered 2021-02-18: 20 mg via ORAL
  Filled 2021-02-18: qty 1

## 2021-02-18 MED ORDER — LIDOCAINE HCL (PF) 1 % IJ SOLN
INTRAMUSCULAR | Status: AC
  Filled 2021-02-18: qty 30

## 2021-02-18 MED ORDER — OXYCODONE HCL 5 MG PO TABS
ORAL_TABLET | ORAL | Status: AC
  Filled 2021-02-18: qty 1

## 2021-02-18 MED ORDER — DOCUSATE SODIUM 100 MG PO CAPS: 100.0000 mg | ORAL_CAPSULE | Freq: Two times a day (BID) | ORAL | 0 refills | Status: AC | PRN

## 2021-02-18 MED ORDER — ORAL CARE MOUTH RINSE: 15.0000 mL | Freq: Once | OROMUCOSAL | Status: AC

## 2021-02-18 MED ORDER — ROCURONIUM BROMIDE 100 MG/10ML IV SOLN
INTRAVENOUS | Status: DC | PRN
Start: 2021-02-18 — End: 2021-02-18
  Administered 2021-02-18: 40 mg via INTRAVENOUS

## 2021-02-18 MED ORDER — FENTANYL CITRATE (PF) 100 MCG/2ML IJ SOLN
INTRAMUSCULAR | Status: DC | PRN
  Administered 2021-02-18 (×2): 25 ug via INTRAVENOUS
  Administered 2021-02-18: 50 ug via INTRAVENOUS

## 2021-02-18 MED ORDER — HYDROCODONE-ACETAMINOPHEN 5-325 MG PO TABS: 1.0000 | ORAL_TABLET | Freq: Four times a day (QID) | ORAL | 0 refills | Status: DC | PRN

## 2021-02-18 MED ORDER — SUGAMMADEX SODIUM 200 MG/2ML IV SOLN
INTRAVENOUS | Status: DC | PRN
  Administered 2021-02-18: 150 mg via INTRAVENOUS

## 2021-02-18 MED ORDER — CHLORHEXIDINE GLUCONATE CLOTH 2 % EX PADS
6.0000 | MEDICATED_PAD | Freq: Once | CUTANEOUS | Status: AC
  Administered 2021-02-18: 6 via TOPICAL

## 2021-02-18 MED ORDER — LIDOCAINE HCL (CARDIAC) PF 100 MG/5ML IV SOSY
PREFILLED_SYRINGE | INTRAVENOUS | Status: DC | PRN
  Administered 2021-02-18: 100 mg via INTRAVENOUS

## 2021-02-18 MED ORDER — EPHEDRINE SULFATE (PRESSORS) 50 MG/ML IJ SOLN
INTRAMUSCULAR | Status: DC | PRN
  Administered 2021-02-18: 10 mg via INTRAVENOUS
  Administered 2021-02-18: 5 mg via INTRAVENOUS
  Administered 2021-02-18: 10 mg via INTRAVENOUS

## 2021-02-18 MED ORDER — GABAPENTIN 300 MG PO CAPS: 300.0000 mg | ORAL_CAPSULE | ORAL | Status: AC

## 2021-02-18 MED ORDER — PROMETHAZINE HCL 25 MG/ML IJ SOLN: 6.2500 mg | INTRAMUSCULAR | Status: DC | PRN

## 2021-02-18 MED ORDER — CHLORHEXIDINE GLUCONATE 0.12 % MT SOLN: 15.0000 mL | Freq: Once | OROMUCOSAL | Status: AC

## 2021-02-18 MED ORDER — BUPIVACAINE-EPINEPHRINE (PF) 0.5% -1:200000 IJ SOLN
INTRAMUSCULAR | Status: AC
  Filled 2021-02-18: qty 30

## 2021-02-18 MED ORDER — ACETAMINOPHEN 500 MG PO TABS
ORAL_TABLET | ORAL | Status: AC
  Administered 2021-02-18: 1000 mg via ORAL
  Filled 2021-02-18: qty 2

## 2021-02-18 MED ORDER — BUPIVACAINE-EPINEPHRINE (PF) 0.5% -1:200000 IJ SOLN
INTRAMUSCULAR | Status: DC | PRN
  Administered 2021-02-18: 10 mL

## 2021-02-18 MED ORDER — GABAPENTIN 300 MG PO CAPS
ORAL_CAPSULE | ORAL | Status: AC
  Administered 2021-02-18: 300 mg via ORAL
  Filled 2021-02-18: qty 1

## 2021-02-18 MED ORDER — FENTANYL CITRATE (PF) 100 MCG/2ML IJ SOLN: 25.0000 ug | INTRAMUSCULAR | Status: DC | PRN

## 2021-02-18 MED ORDER — ACETAMINOPHEN 325 MG PO TABS: 650.0000 mg | ORAL_TABLET | Freq: Three times a day (TID) | ORAL | 0 refills | Status: AC | PRN

## 2021-02-18 MED ORDER — BUPIVACAINE-MELOXICAM ER 200-6 MG/7ML IJ SOLN
INTRAMUSCULAR | Status: AC
  Filled 2021-02-18: qty 1

## 2021-02-18 MED ORDER — OXYCODONE HCL 5 MG/5ML PO SOLN: 5.0000 mg | Freq: Once | ORAL | Status: AC | PRN

## 2021-02-18 MED ORDER — BUPIVACAINE LIPOSOME 1.3 % IJ SUSP
INTRAMUSCULAR | Status: AC
  Filled 2021-02-18: qty 20

## 2021-02-18 MED ORDER — LACTATED RINGERS IV SOLN: INTRAVENOUS | Status: DC | PRN

## 2021-02-18 MED ORDER — ONDANSETRON HCL 4 MG/2ML IJ SOLN
INTRAMUSCULAR | Status: DC | PRN
  Administered 2021-02-18: 4 mg via INTRAVENOUS

## 2021-02-18 MED ORDER — ACETAMINOPHEN 10 MG/ML IV SOLN: 1000.0000 mg | Freq: Once | INTRAVENOUS | Status: DC | PRN

## 2021-02-18 MED ORDER — FENTANYL CITRATE (PF) 100 MCG/2ML IJ SOLN
INTRAMUSCULAR | Status: AC
  Filled 2021-02-18: qty 2

## 2021-02-18 MED ORDER — OXYCODONE HCL 5 MG PO TABS
5.0000 mg | ORAL_TABLET | Freq: Once | ORAL | Status: AC | PRN
  Administered 2021-02-18: 5 mg via ORAL

## 2021-02-18 MED ORDER — LACTATED RINGERS IV SOLN: INTRAVENOUS | Status: DC

## 2021-02-18 MED ORDER — BUPIVACAINE-MELOXICAM ER 200-6 MG/7ML IJ SOLN
INTRAMUSCULAR | Status: DC | PRN
  Administered 2021-02-18: 7 mL

## 2021-02-18 MED ORDER — CHLORHEXIDINE GLUCONATE 0.12 % MT SOLN
OROMUCOSAL | Status: AC
  Administered 2021-02-18: 15 mL via OROMUCOSAL
  Filled 2021-02-18: qty 15

## 2021-02-18 MED ORDER — ACETAMINOPHEN 500 MG PO TABS: 1000.0000 mg | ORAL_TABLET | ORAL | Status: AC

## 2021-02-18 MED ORDER — PROPOFOL 10 MG/ML IV BOLUS
INTRAVENOUS | Status: DC | PRN
  Administered 2021-02-18: 50 mg via INTRAVENOUS
  Administered 2021-02-18: 150 mg via INTRAVENOUS

## 2021-02-18 MED ORDER — CEFAZOLIN SODIUM-DEXTROSE 2-4 GM/100ML-% IV SOLN
INTRAVENOUS | Status: AC
  Filled 2021-02-18: qty 100

## 2021-02-18 SURGICAL SUPPLY — 39 items
BLADE CLIPPER SURG (BLADE) ×2 IMPLANT
BLADE SURG 15 STRL LF DISP TIS (BLADE) ×1 IMPLANT
BLADE SURG 15 STRL SS (BLADE) ×1
CHLORAPREP W/TINT 26 (MISCELLANEOUS) ×2 IMPLANT
DERMABOND ADVANCED (GAUZE/BANDAGES/DRESSINGS) ×1
DERMABOND ADVANCED .7 DNX12 (GAUZE/BANDAGES/DRESSINGS) ×1 IMPLANT
DRAIN PENROSE 12X.25 LTX STRL (MISCELLANEOUS) ×2 IMPLANT
DRAPE LAPAROTOMY 100X77 ABD (DRAPES) ×2 IMPLANT
ELECT REM PT RETURN 9FT ADLT (ELECTROSURGICAL) ×2
ELECTRODE REM PT RTRN 9FT ADLT (ELECTROSURGICAL) ×1 IMPLANT
GAUZE 4X4 16PLY ~~LOC~~+RFID DBL (SPONGE) ×2 IMPLANT
GLOVE SURG SYN 6.5 ES PF (GLOVE) ×4 IMPLANT
GLOVE SURG SYN 6.5 PF PI (GLOVE) ×2 IMPLANT
GLOVE SURG UNDER POLY LF SZ7 (GLOVE) ×2 IMPLANT
GOWN STRL REUS W/ TWL LRG LVL3 (GOWN DISPOSABLE) ×2 IMPLANT
GOWN STRL REUS W/TWL LRG LVL3 (GOWN DISPOSABLE) ×2
LABEL OR SOLS (LABEL) ×2 IMPLANT
MANIFOLD NEPTUNE II (INSTRUMENTS) ×2 IMPLANT
MESH PARIETEX PROGRIP RIGHT (Mesh General) ×1 IMPLANT
NEEDLE HYPO 22GX1.5 SAFETY (NEEDLE) ×4 IMPLANT
NS IRRIG 500ML POUR BTL (IV SOLUTION) ×2 IMPLANT
PACK BASIN MINOR ARMC (MISCELLANEOUS) ×2 IMPLANT
SUT ETHIBOND NAB MO 7 #0 18IN (SUTURE) ×2 IMPLANT
SUT MNCRL 4-0 (SUTURE) ×1
SUT MNCRL 4-0 27XMFL (SUTURE) ×1
SUT SILK 3 0 12 30 (SUTURE) IMPLANT
SUT SILK 3 0 SH 30 (SUTURE) IMPLANT
SUT VIC AB 2-0 CT2 27 (SUTURE) ×2 IMPLANT
SUT VIC AB 2-0 SH 27 (SUTURE) ×1
SUT VIC AB 2-0 SH 27XBRD (SUTURE) IMPLANT
SUT VIC AB 3-0 SH 27 (SUTURE) ×1
SUT VIC AB 3-0 SH 27X BRD (SUTURE) ×1 IMPLANT
SUTURE MNCRL 4-0 27XMF (SUTURE) ×1 IMPLANT
SYR 10ML LL (SYRINGE) ×2 IMPLANT
SYR 20ML LL LF (SYRINGE) ×2 IMPLANT
SYR BULB IRRIG 60ML STRL (SYRINGE) ×2 IMPLANT
TOWEL OR 17X26 4PK STRL BLUE (TOWEL DISPOSABLE) ×2 IMPLANT
WATER STERILE IRR 1000ML POUR (IV SOLUTION) ×2 IMPLANT
WATER STERILE IRR 500ML POUR (IV SOLUTION) ×2 IMPLANT

## 2021-02-18 NOTE — Anesthesia Preprocedure Evaluation (Addendum)
Anesthesia Evaluation  Patient identified by MRN, date of birth, ID band Patient awake    Reviewed: Allergy & Precautions, NPO status , Patient's Chart, lab work & pertinent test results  Airway Mallampati: II  TM Distance: >3 FB Neck ROM: full    Dental  (+) Missing   Pulmonary COPD (no inhaler), former smoker,  History of 80 pack years of smoking   Pulmonary exam normal        Cardiovascular hypertension, Pt. on medications Normal cardiovascular exam     Neuro/Psych PSYCHIATRIC DISORDERS Depression negative neurological ROS     GI/Hepatic Neg liver ROS, GERD  Controlled,Unilateral recurrent inguinal hernia without obstruction or gangrene    Endo/Other  negative endocrine ROS  Renal/GU      Musculoskeletal  (+) Arthritis ,   Abdominal Normal abdominal exam  (+)   Peds  Hematology negative hematology ROS (+)   Anesthesia Other Findings Past Medical History: No date: Aortic atherosclerosis (HCC) No date: Arthritis     Comment:  knee and hands No date: COPD (chronic obstructive pulmonary disease) (HCC)     Comment:  no inhalers No date: GERD (gastroesophageal reflux disease)     Comment:  occ No date: High cholesterol No date: Hypertension  Past Surgical History: No date: COLONOSCOPY No date: WISDOM TOOTH EXTRACTION 06/13/2019: XI ROBOTIC ASSISTED INGUINAL HERNIA REPAIR WITH MESH;  Right     Comment:  Procedure: XI ROBOTIC ASSISTED INGUINAL HERNIA REPAIR               WITH MESH;  Surgeon: Benjamine Sprague, DO;  Location: [facility]               ORS;  Service: General;  Laterality: Right;     Reproductive/Obstetrics negative OB ROS                            Anesthesia Physical Anesthesia Plan  ASA: 2  Anesthesia Plan: General ETT   Post-op Pain Management: Gabapentin PO (pre-op) and Tylenol PO (pre-op)   Induction: Intravenous  PONV Risk Score and Plan: 3 and Ondansetron,  Dexamethasone and Treatment may vary due to age or medical condition  Airway Management Planned: Oral ETT  Additional Equipment:   Intra-op Plan:   Post-operative Plan: Extubation in OR  Informed Consent: I have reviewed the patients History and Physical, chart, labs and discussed the procedure including the risks, benefits and alternatives for the proposed anesthesia with the patient or authorized representative who has indicated his/her understanding and acceptance.       Plan Discussed with: Anesthesiologist, CRNA and Surgeon  Anesthesia Plan Comments:        Anesthesia Quick Evaluation

## 2021-02-18 NOTE — Discharge Instructions (Addendum)
Hernia repair, Care After This sheet gives you information about how to care for yourself after your procedure. Your health care provider may also give you more specific instructions. If you have problems or questions, contact your health care provider. What can I expect after the procedure? After your procedure, it is common to have the following: Pain in your abdomen, especially in the incision areas. You will be given medicine to control the pain. Tiredness. This is a normal part of the recovery process. Your energy level will return to normal over the next several weeks. Changes in your bowel movements, such as constipation or needing to go more often. Talk with your health care provider about how to manage this. Follow these instructions at home: Medicines  tylenol and advil as needed for discomfort.  Please alternate between the two every four hours as needed for pain.    Use narcotics, if prescribed, only when tylenol and motrin is not enough to control pain.  325-650mg  every 8hrs to max of 3000mg /24hrs (including the 325mg  in every norco dose) for the tylenol.    Advil up to 800mg  per dose every 8hrs as needed for pain.   PLEASE RECORD NUMBER OF PILLS TAKEN UNTIL NEXT FOLLOW UP APPT.  THIS WILL HELP DETERMINE HOW READY YOU ARE TO BE RELEASED FROM ANY ACTIVITY RESTRICTIONS Do not drive or use heavy machinery while taking prescription pain medicine. Do not drink alcohol while taking prescription pain medicine.  Incision care    Follow instructions from your health care provider about how to take care of your incision areas. Make sure you: Keep your incisions clean and dry. Wash your hands with soap and water before and after applying medicine to the areas, and before and after changing your bandage (dressing). If soap and water are not available, use hand sanitizer. Change your dressing as told by your health care provider. Leave stitches (sutures), skin glue, or adhesive strips in  place. These skin closures may need to stay in place for 2 weeks or longer. If adhesive strip edges start to loosen and curl up, you may trim the loose edges. Do not remove adhesive strips completely unless your health care provider tells you to do that. Do not wear tight clothing over the incisions. Tight clothing may rub and irritate the incision areas, which may cause the incisions to open. Do not take baths, swim, or use a hot tub until your health care provider approves. OK TO SHOWER IN 24HRS.   Check your incision area every day for signs of infection. Check for: More redness, swelling, or pain. More fluid or blood. Warmth. Pus or a bad smell. Activity Avoid lifting anything that is heavier than 10 lb (4.5 kg) for 2 weeks or until your health care provider says it is okay. No pushing/pulling greater than 30lbs You may resume normal activities as told by your health care provider. Ask your health care provider what activities are safe for you. Take rest breaks during the day as needed. Eating and drinking Follow instructions from your health care provider about what you can eat after surgery. To prevent or treat constipation while you are taking prescription pain medicine, your health care provider may recommend that you: Drink enough fluid to keep your urine clear or pale yellow. Take over-the-counter or prescription medicines. Eat foods that are high in fiber, such as fresh fruits and vegetables, whole grains, and beans. Limit foods that are high in fat and processed sugars, such as fried and  sweet foods. General instructions Ask your health care provider when you will need an appointment to get your sutures or staples removed. Keep all follow-up visits as told by your health care provider. This is important. Contact a health care provider if: You have more redness, swelling, or pain around your incisions. You have more fluid or blood coming from the incisions. Your incisions feel  warm to the touch. You have pus or a bad smell coming from your incisions or your dressing. You have a fever. You have an incision that breaks open (edges not staying together) after sutures or staples have been removed. You develop a rash. You have chest pain or difficulty breathing. You have pain or swelling in your legs. You feel light-headed or you faint. Your abdomen swells (becomes distended). You have nausea or vomiting. You have blood in your stool (feces). This information is not intended to replace advice given to you by your health care provider. Make sure you discuss any questions you have with your health care provider. Document Released: 07/15/2004 Document Revised: 09/14/2017 Document Reviewed: 09/27/2015 Elsevier Interactive Patient Education  2019 [employer]   The drugs that you were given will stay in your system until tomorrow so for the next 24 hours you should not:  Drive an automobile Make any legal decisions Drink any alcoholic beverage   You may resume regular meals tomorrow.  Today it is better to start with liquids and gradually work up to solid foods.  You may eat anything you prefer, but it is better to start with liquids, then soup and crackers, and gradually work up to solid foods.   Please notify your doctor immediately if you have any unusual bleeding, trouble breathing, redness and pain at the surgery site, drainage, fever, or pain not relieved by medication.     Your post-operative visit with Dr.                                       is: Date:                        Time:    Please call to schedule your post-operative visit.  Additional Instructions:

## 2021-02-18 NOTE — Interval H&P Note (Signed)
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  The various methods of treatment have been discussed with the patient and family. After consideration of risks, benefits and other options for treatment, the patient has consented to  Procedure(s): HERNIA REPAIR INGUINAL ADULT WITH MESH (Right) as a surgical intervention.  The patient's history has been reviewed, patient examined, no change in status, stable for surgery.  I have reviewed the patient's chart and labs.  Questions were answered to the patient's satisfaction.     Yonah Tangeman Lysle Pearl

## 2021-02-18 NOTE — Anesthesia Procedure Notes (Signed)
Procedure Name: Intubation Date/Time: 02/18/2021 5:05 PM Performed by: Beverely Low, CRNA Pre-anesthesia Checklist: Patient identified, Patient being monitored, Timeout performed, Emergency Drugs available and Suction available Patient Re-evaluated:Patient Re-evaluated prior to induction Oxygen Delivery Method: Circle system utilized Preoxygenation: Pre-oxygenation with 100% oxygen Induction Type: IV induction Ventilation: Mask ventilation without difficulty Laryngoscope Size: 3 and McGraph Grade View: Grade I Tube type: Oral Tube size: 7.0 mm Number of attempts: 1 Airway Equipment and Method: Stylet Placement Confirmation: ETT inserted through vocal cords under direct vision, positive ETCO2 and breath sounds checked- equal and bilateral Secured at: 21 cm Tube secured with: Tape Dental Injury: Teeth and Oropharynx as per pre-operative assessment

## 2021-02-18 NOTE — Anesthesia Postprocedure Evaluation (Signed)
Anesthesia Post Note  Patient: Terry Sheppard  Procedure(s) Performed: HERNIA REPAIR INGUINAL ADULT WITH MESH (Right: Groin)  Patient location during evaluation: PACU Anesthesia Type: General Level of consciousness: awake and alert Pain management: pain level controlled Vital Signs Assessment: post-procedure vital signs reviewed and stable Respiratory status: spontaneous breathing, nonlabored ventilation, respiratory function stable and patient connected to nasal cannula oxygen Cardiovascular status: blood pressure returned to baseline and stable Postop Assessment: no apparent nausea or vomiting Anesthetic complications: no   No notable events documented.   Last Vitals:  Vitals:   02/18/21 1830 02/18/21 1846  BP: (!) 165/66 (!) 162/66  Pulse: 78 68  Resp: 19 13  Temp:    SpO2: 95% 97%    Last Pain:  Vitals:   02/18/21 1846  TempSrc:   PainSc: 0-No pain                 Arita Miss

## 2021-02-18 NOTE — Op Note (Signed)
Preoperative diagnosis: Right recurrent reducible inguinal Hernia.  Postoperative diagnosis: Right recurrent reducible inguinal Hernia  Procedure:  Open right inguinal hernia repair with mesh  Anesthesia: General, LMA  Surgeon: Dr. Lysle Pearl  Wound Classification: Clean  Specimen: none  Complications: None  Estimated Blood Loss: 20mL   Indications:  Patient is a 76 y.o. male developed a symptomatic right recurrent inguinal hernia. Repair was indicated to avoid complications of incarceration, obstruction and pain, and a prosthetic mesh repair was elected.  Findings: 1. Vas Deferens and cord structures identified and preserved 2. Progrip mesh used for repair 3. Adequate hemostasis achieved  Description of procedure: The patient was taken to the operating room. A time-out was completed verifying correct patient, procedure, site, positioning, and implant(s) and/or special equipment prior to beginning this procedure. The right groin was prepped and draped in the usual sterile fashion. An incision was marked in a natural skin crease and planned to end near the pubic tubercle.  The skin crease incision was made with a knife and deepened through Scarpas and Campers fascia with electrocautery until the aponeurosis of the external oblique was encountered. This was cleaned and the external ring was exposed. Hemostasis was achieved in the wound. An incision was made in the midportion of the external oblique aponeurosis in the direction of its fibers. The ilioinguinal nerve was identified and protected throughout the dissection. Flaps of the external oblique were developed cephalad and inferiorly.  The cord was identified. It was gently dissected free at the pubic tubercle and encircled with a Penrose drain. Attention was directed to the anteromedial aspect of the cord, where an indirect hernia sac was identified. The sac was carefully dissected free of the cord down to the level of the internal ring  and reduced back into abdominal cavity along with an adjacent cord lipoma. The vas and testicular vessels were identified and protected from harm.   Attention then turned to the floor of the canal, which was intact. The Progrip mesh was inserted and secured to the pubic tubercle using interrupted 0 ethibond sutures. Care was taken to assure that the mesh was placed flat against the floor and wrapped loosely around the cord structures.  Hemostasis was again checked. The Penrose drain was removed.  The external oblique aponeurosis was closed with a running suture of 2-0 Vicryl, taking care not to catch the ilioinguinal nerve in the suture line. Zynrelef infused into operative field. Scarpas fascia was closed with interrupted 3-0 Vicryl.  The deep dermal layer closed with interrupted 3-0 Vicryl.  The skin was closed with a subcuticular stitch of Monocryl 4-0. Dermabond was applied.  The testis was gently pulled down into its anatomic position in the scrotum.  The patient tolerated the procedure well and was taken to the postanesthesia care unit in stable condition. Sponge and instrument count correct at end of procedure.

## 2021-02-18 NOTE — Transfer of Care (Signed)
Immediate Anesthesia Transfer of Care Note  Patient: MADHAV MOHON  Procedure(s) Performed: HERNIA REPAIR INGUINAL ADULT (Right: Groin)  Patient Location: PACU  Anesthesia Type:General  Level of Consciousness: drowsy  Airway & Oxygen Therapy: Patient Spontanous Breathing and Patient connected to face mask  Post-op Assessment: Report given to RN and Post -op Vital signs reviewed and stable  Post vital signs: Reviewed and stable  Last Vitals:  Vitals Value Taken Time  BP 165/66 02/18/21 1830  Temp 36.3 C 02/18/21 1827  Pulse 74 02/18/21 1834  Resp 18 02/18/21 1834  SpO2 94 % 02/18/21 1834  Vitals shown include unvalidated device data.  Last Pain:  Vitals:   02/18/21 1830  TempSrc:   PainSc: Asleep         Complications: No notable events documented.

## 2021-02-21 ENCOUNTER — Encounter: Payer: Self-pay | Admitting: Surgery

## 2021-05-05 ENCOUNTER — Ambulatory Visit (INDEPENDENT_AMBULATORY_CARE_PROVIDER_SITE_OTHER): Payer: Medicare Other | Admitting: Podiatry

## 2021-05-05 DIAGNOSIS — Q828 Other specified congenital malformations of skin: Secondary | ICD-10-CM | POA: Diagnosis not present

## 2021-05-05 DIAGNOSIS — K219 Gastro-esophageal reflux disease without esophagitis: Secondary | ICD-10-CM | POA: Insufficient documentation

## 2021-05-05 DIAGNOSIS — Z87891 Personal history of nicotine dependence: Secondary | ICD-10-CM | POA: Insufficient documentation

## 2021-05-05 DIAGNOSIS — E785 Hyperlipidemia, unspecified: Secondary | ICD-10-CM | POA: Insufficient documentation

## 2021-05-05 NOTE — Progress Notes (Signed)
?  Subjective:  ?Patient ID: Terry Sheppard, male    DOB: 09-29-1945,  MRN: [id number] ? ?Chief Complaint  ?Patient presents with  ? Callouses  ? ? ?76 y.o. male presents with the above complaint.  Patient presents with complaint of right submetatarsal 5 porokeratotic lesion.  Is painful to touch is progressive gotten worse.  He has not seen anyone else prior to seeing me.  He denies any other acute complaints.  Hurts with ambulation and hurts with pressure. ? ? ?Review of Systems: Negative except as noted in the HPI. Denies N/V/F/Ch. ? ?Past Medical History:  ?Diagnosis Date  ? Aortic atherosclerosis ([place])   ? Arthritis   ? knee and hands  ? COPD (chronic obstructive pulmonary disease) ([place])   ? no inhalers  ? GERD (gastroesophageal reflux disease)   ? occ  ? High cholesterol   ? Hypertension   ? ? ?Current Outpatient Medications:  ?  calcium carbonate (TUMS - DOSED IN MG ELEMENTAL CALCIUM) 500 MG chewable tablet, Chew 1 tablet by mouth as needed for indigestion or heartburn., Disp: , Rfl:  ?  citalopram (CELEXA) 10 MG tablet, Take 10 mg by mouth every morning. , Disp: , Rfl:  ?  gabapentin (NEURONTIN) 300 MG capsule, Take 300 mg by mouth 2 (two) times daily., Disp: , Rfl:  ?  HYDROcodone-acetaminophen (NORCO) 5-325 MG tablet, Take 1 tablet by mouth every 6 (six) hours as needed for up to 6 doses for moderate pain., Disp: 6 tablet, Rfl: 0 ?  losartan-hydrochlorothiazide (HYZAAR) 100-12.5 MG tablet, Take 1 tablet by mouth every morning., Disp: , Rfl:  ?  meloxicam (MOBIC) 15 MG tablet, Take 15 mg by mouth daily., Disp: , Rfl:  ?  pravastatin (PRAVACHOL) 40 MG tablet, Take 80 mg by mouth every morning., Disp: , Rfl:  ?  sildenafil (REVATIO) 20 MG tablet, Take 1 tablet by mouth daily as needed., Disp: , Rfl:  ? ?Social History  ? ?Tobacco Use  ?Smoking Status Former  ? Packs/day: 2.00  ? Years: 40.00  ? Pack years: 80.00  ? Types: Cigarettes  ? Quit date: 06/02/1996  ? Years since quitting: 24.9  ?Smokeless Tobacco  Never  ? ? ?No Known Allergies ?Objective:  ?There were no vitals filed for this visit. ?There is no height or weight on file to calculate BMI. ?Constitutional Well developed. ?Well nourished.  ?Vascular Dorsalis pedis pulses palpable bilaterally. ?Posterior tibial pulses palpable bilaterally. ?Capillary refill normal to all digits.  ?No cyanosis or clubbing noted. ?Pedal hair growth normal.  ?Neurologic Normal speech. ?Oriented to person, place, and time. ?Epicritic sensation to light touch grossly present bilaterally.  ?Dermatologic Right submetatarsal 5 porokeratotic lesion/benign skin lesion.  Painful to touch.  ?Orthopedic: Normal joint ROM without pain or crepitus bilaterally. ?No visible deformities. ?No bony tenderness.  ? ?Radiographs: None ?Assessment:  ? ?1. Porokeratosis   ? ?Plan:  ?Patient was evaluated and treated and all questions answered. ? ?Right submetatarsal 5 porokeratotic lesion ?-All questions and concerns were discussed with the patient in extensive detail ?-Given the amount of pain that he is experiencing I believe would benefit from debridement of the lesion.  Using chisel blade handle the lesion was debrided down to healthy scar tissue.  No complication noted no pinpoint bleeding noted. ?-If there is no resolve meant we discussed surgical options ? ?No follow-ups on file. ?

## 2021-06-16 ENCOUNTER — Ambulatory Visit: Payer: Medicare Other | Admitting: Podiatry

## 2021-11-03 ENCOUNTER — Ambulatory Visit: Payer: Medicare Other | Admitting: Podiatry

## 2021-11-03 DIAGNOSIS — Q828 Other specified congenital malformations of skin: Secondary | ICD-10-CM

## 2021-11-03 NOTE — Progress Notes (Signed)
  Subjective:  Patient ID: Terry Sheppard, male    DOB: [birth year],  MRN: [id number]  Chief Complaint  Patient presents with   Callouses    76 y.o. male presents with the above complaint.  Patient presents with complaint of right submetatarsal 5 porokeratotic lesion.  Is painful to touch is progressive gotten worse.  He has not seen anyone else prior to seeing me.  He denies any other acute complaints.  Hurts with ambulation and hurts with pressure.   Review of Systems: Negative except as noted in the HPI. Denies N/V/F/Ch.  Past Medical History:  Diagnosis Date   Aortic atherosclerosis ([facility])    Arthritis    knee and hands   COPD (chronic obstructive pulmonary disease) ([facility])    no inhalers   GERD (gastroesophageal reflux disease)    occ   High cholesterol    Hypertension     Current Outpatient Medications:    calcium carbonate (TUMS - DOSED IN MG ELEMENTAL CALCIUM) 500 MG chewable tablet, Chew 1 tablet by mouth as needed for indigestion or heartburn., Disp: , Rfl:    citalopram (CELEXA) 10 MG tablet, Take 10 mg by mouth every morning. , Disp: , Rfl:    gabapentin (NEURONTIN) 300 MG capsule, Take 300 mg by mouth 2 (two) times daily., Disp: , Rfl:    HYDROcodone-acetaminophen (NORCO) 5-325 MG tablet, Take 1 tablet by mouth every 6 (six) hours as needed for up to 6 doses for moderate pain., Disp: 6 tablet, Rfl: 0   losartan-hydrochlorothiazide (HYZAAR) 100-12.5 MG tablet, Take 1 tablet by mouth every morning., Disp: , Rfl:    meloxicam (MOBIC) 15 MG tablet, Take 15 mg by mouth daily., Disp: , Rfl:    pravastatin (PRAVACHOL) 40 MG tablet, Take 80 mg by mouth every morning., Disp: , Rfl:    sildenafil (REVATIO) 20 MG tablet, Take 1 tablet by mouth daily as needed., Disp: , Rfl:   Social History   Tobacco Use  Smoking Status Former   Packs/day: 2.00   Years: 40.00   Total pack years: 80.00   Types: Cigarettes   Quit date: 06/02/1996   Years since quitting: 25.4  Smokeless  Tobacco Never    No Known Allergies Objective:  There were no vitals filed for this visit. There is no height or weight on file to calculate BMI. Constitutional Well developed. Well nourished.  Vascular Dorsalis pedis pulses palpable bilaterally. Posterior tibial pulses palpable bilaterally. Capillary refill normal to all digits.  No cyanosis or clubbing noted. Pedal hair growth normal.  Neurologic Normal speech. Oriented to person, place, and time. Epicritic sensation to light touch grossly present bilaterally.  Dermatologic Right submetatarsal 5 porokeratotic lesion/benign skin lesion.  Painful to touch.  Orthopedic: Normal joint ROM without pain or crepitus bilaterally. No visible deformities. No bony tenderness.   Radiographs: None Assessment:   No diagnosis found.  Plan:  Patient was evaluated and treated and all questions answered.  Right submetatarsal 5 porokeratotic lesion -All questions and concerns were discussed with the patient in extensive detail -Given the amount of pain that he is experiencing I believe would benefit from debridement of the lesion.  Using chisel blade handle the lesion was debrided down to healthy scar tissue.  No complication noted no pinpoint bleeding noted. -If there is no resolve meant we discussed surgical options  No follow-ups on file.

## 2022-01-23 ENCOUNTER — Ambulatory Visit: Payer: Self-pay | Admitting: Surgery

## 2022-01-23 NOTE — H&P (Signed)
Subjective:   CC: Non-recurrent unilateral inguinal hernia without obstruction or gangrene [K40.90]  HPI:  Terry Sheppard is a 77 y.o. male who returns for evaluation of above. Symptoms were first noted a few months ago. Pain is dull and intermittent, confined to the left groin, without radiation.  Associated with bulge, exacerbated by eating,  Lump is reducible.    Past Medical History:  has a past medical history of Anxiety, Arthritis, COPD (chronic obstructive pulmonary disease) ([facility]), Former tobacco use, GERD (gastroesophageal reflux disease), HLD (hyperlipidemia), Hypertension, PNA (pneumonia), and Right wrist fracture.  Past Surgical History:  Past Surgical History:  Procedure Laterality Date   LAPAROSCOPIC INGUINAL HERNIA REPAIR Right 06/2019   Robotic by Eliannah Hinde   COLONOSCOPY  03/17/2020   Diverticulosis/Otherwise normal colon/Repeat 1yr/Norvella Loscalzo   INGUINAL HERNIA REPAIR Right 02/18/2021   Dr IBenjamine Sprague-- OPEN for recurrent   TONSILLECTOMY      Family History: family history includes Breast cancer (age of onset: 518 in his mother; Dementia in his sister; Fibrocystic breast disease in his daughter; Pancreatic cancer in his maternal grandfather; Skin cancer in his father.  Social History:  reports that he quit smoking about 25 years ago. His smoking use included cigarettes. He started smoking about 65 years ago. He smoked an average of 2 packs per day. He has never used smokeless tobacco. He reports that he does not currently use alcohol. He reports that he does not use drugs.  Current Medications: has a current medication list which includes the following prescription(s): citalopram, losartan-hydrochlorothiazide, and pravastatin.  Allergies:  Allergies as of 01/23/2022 - Reviewed 01/23/2022  Allergen Reaction Noted   Lisinopril Cough 10/13/2015    ROS:  A 15 point review of systems was performed and pertinent positives and negatives noted in HPI   Objective:      BP 123/67   Pulse 70   Ht 170.2 cm ('5\' 7"'$ )   Wt 77.1 kg (170 lb)   BMI 26.63 kg/m   Constitutional :  Alert, cooperative, no distress  Lymphatics/Throat:  Supple, no lymphadenopathy  Respiratory:  clear to auscultation bilaterally  Cardiovascular:  regular rate and rhythm  Gastrointestinal: soft, non-tender; bowel sounds normal; no masses,  no organomegaly. inguinal hernia noted.  moderate, reducible, no overlying skin changes, and LEFT  Musculoskeletal: Steady gait and movement  Skin: Cool and moist, lap surgical scars  Psychiatric: Normal affect, non-agitated, not confused       LABS:  N/A   RADS: N/A Assessment:       Non-recurrent unilateral inguinal hernia without obstruction or gangrene [K40.90]  Plan:     1. Non-recurrent unilateral inguinal hernia without obstruction or gangrene [K40.90]   Discussed the risk of surgery including recurrence, which can be up to 50% in the case of incisional or complex hernias, possible use of prosthetic materials (mesh) and the increased risk of mesh infxn if used, bleeding, chronic pain, post-op infxn, post-op SBO or ileus, and possible re-operation to address said risks. The risks of general anesthetic, if used, includes MI, CVA, sudden death or even reaction to anesthetic medications also discussed. Alternatives include continued observation.  Benefits include possible symptom relief, prevention of incarceration, strangulation, enlargement in size over time, and the risk of emergency surgery in the face of strangulation.   Typical post-op recovery time of 3-5 days with 2 weeks of activity restrictions were also discussed.  ED return precautions given for sudden increase in pain, size of hernia with accompanying fever, nausea, and/or vomiting.  The patient verbalized understanding and all questions were answered to the patient's satisfaction.   2. Patient has elected to proceed with surgical treatment. Procedure will be scheduled.  left, robotic assisted laparoscopic Hold aspirin 5days prior  labs/images/medications/previous chart entries reviewed personally and relevant changes/updates noted above.

## 2022-01-23 NOTE — H&P (View-Only) (Signed)
Subjective:   CC: Non-recurrent unilateral inguinal hernia without obstruction or gangrene [K40.90]  HPI:  Terry Sheppard is a 77 y.o. male who returns for evaluation of above. Symptoms were first noted a few months ago. Pain is dull and intermittent, confined to the left groin, without radiation.  Associated with bulge, exacerbated by eating,  Lump is reducible.    Past Medical History:  has a past medical history of Anxiety, Arthritis, COPD (chronic obstructive pulmonary disease) (CMS-HCC), Former tobacco use, GERD (gastroesophageal reflux disease), HLD (hyperlipidemia), Hypertension, PNA (pneumonia), and Right wrist fracture.  Past Surgical History:  Past Surgical History:  Procedure Laterality Date   LAPAROSCOPIC INGUINAL HERNIA REPAIR Right 06/2019   Robotic by Terry Sheppard   COLONOSCOPY  03/17/2020   Diverticulosis/Otherwise normal colon/Repeat 14yr/Terry Sheppard   INGUINAL HERNIA REPAIR Right 02/18/2021   Dr Terry Sprague-- OPEN for recurrent   TONSILLECTOMY      Family History: family history includes Breast cancer (age of onset: 5104 in his mother; Dementia in his sister; Fibrocystic breast disease in his daughter; Pancreatic cancer in his maternal grandfather; Skin cancer in his father.  Social History:  reports that he quit smoking about 25 years ago. His smoking use included cigarettes. He started smoking about 65 years ago. He smoked an average of 2 packs per day. He has never used smokeless tobacco. He reports that he does not currently use alcohol. He reports that he does not use drugs.  Current Medications: has a current medication list which includes the following prescription(s): citalopram, losartan-hydrochlorothiazide, and pravastatin.  Allergies:  Allergies as of 01/23/2022 - Reviewed 01/23/2022  Allergen Reaction Noted   Lisinopril Cough 10/13/2015    ROS:  A 15 point review of systems was performed and pertinent positives and negatives noted in HPI   Objective:      BP 123/67   Pulse 70   Ht 170.2 cm ('5\' 7"'$ )   Wt 77.1 kg (170 lb)   BMI 26.63 kg/m   Constitutional :  Alert, cooperative, no distress  Lymphatics/Throat:  Supple, no lymphadenopathy  Respiratory:  clear to auscultation bilaterally  Cardiovascular:  regular rate and rhythm  Gastrointestinal: soft, non-tender; bowel sounds normal; no masses,  no organomegaly. inguinal hernia noted.  moderate, reducible, no overlying skin changes, and LEFT  Musculoskeletal: Steady gait and movement  Skin: Cool and moist, lap surgical scars  Psychiatric: Normal affect, non-agitated, not confused       LABS:  N/A   RADS: N/A Assessment:       Non-recurrent unilateral inguinal hernia without obstruction or gangrene [K40.90]  Plan:     1. Non-recurrent unilateral inguinal hernia without obstruction or gangrene [K40.90]   Discussed the risk of surgery including recurrence, which can be up to 50% in the case of incisional or complex hernias, possible use of prosthetic materials (mesh) and the increased risk of mesh infxn if used, bleeding, chronic pain, post-op infxn, post-op SBO or ileus, and possible re-operation to address said risks. The risks of general anesthetic, if used, includes MI, CVA, sudden death or even reaction to anesthetic medications also discussed. Alternatives include continued observation.  Benefits include possible symptom relief, prevention of incarceration, strangulation, enlargement in size over time, and the risk of emergency surgery in the face of strangulation.   Typical post-op recovery time of 3-5 days with 2 weeks of activity restrictions were also discussed.  ED return precautions given for sudden increase in pain, size of hernia with accompanying fever, nausea, and/or vomiting.  The patient verbalized understanding and all questions were answered to the patient's satisfaction.   2. Patient has elected to proceed with surgical treatment. Procedure will be scheduled.  left, robotic assisted laparoscopic Hold aspirin 5days prior  labs/images/medications/previous chart entries reviewed personally and relevant changes/updates noted above.

## 2022-01-31 ENCOUNTER — Inpatient Hospital Stay
Admission: RE | Admit: 2022-01-31 | Discharge: 2022-01-31 | Disposition: A | Discharge status: Home / Self Care | Payer: Medicare Other | Source: Ambulatory Visit

## 2022-01-31 NOTE — Pre-Procedure Instructions (Signed)
Multiple attempts made to reach patient to do his pre op call and review instructions for his surgery on 02/02/22, unable to leave message, I tried both of his alternate contact numbers but unable to leave messages.

## 2022-01-31 NOTE — Patient Instructions (Signed)
Your procedure is scheduled on: 02/03/22 - Friday Report to the Registration Desk on the 1st floor of the [facility]. To find out your arrival time, please call [phone] between 1PM - 3PM on: 02/02/22 - Thursday If your arrival time is 6:00 am, do not arrive prior to that time as the [facility] entrance doors do not open until 6:00 am.  REMEMBER: Instructions that are not followed completely may result in serious medical risk, up to and including death; or upon the discretion of your surgeon and anesthesiologist your surgery may need to be rescheduled.  Do not eat food after midnight the night before surgery.  No gum chewing, lozengers or hard candies.  You may however, drink CLEAR liquids up to 2 hours before you are scheduled to arrive for your surgery. Do not drink anything within 2 hours of your scheduled arrival time. Type 1 and Type 2 diabetics should only drink water.  TAKE THESE MEDICATIONS THE MORNING OF SURGERY WITH A SIP OF WATER:  - citalopram (CELEXA)  - pravastatin (PRAVACHOL)    One week prior to surgery: Stop Anti-inflammatories (NSAIDS) such as Advil, Aleve, Ibuprofen, Motrin, Naproxen, Naprosyn and Aspirin based products such as Excedrin, Goodys Powder, BC Powder.  Stop ANY OVER THE COUNTER supplements until after surgery.  You may however, continue to take Tylenol if needed for pain up until the day of surgery.  No Alcohol for 24 hours before or after surgery.  No Smoking including e-cigarettes for 24 hours prior to surgery.  No chewable tobacco products for at least 6 hours prior to surgery.  No nicotine patches on the day of surgery.  Do not use any "recreational" drugs for at least a week prior to your surgery.  Please be advised that the combination of cocaine and anesthesia may have negative outcomes, up to and including death. If you test positive for cocaine, your surgery will be cancelled.  On the morning of surgery brush your teeth with  toothpaste and water, you may rinse your mouth with mouthwash if you wish. Do not swallow any toothpaste or mouthwash.  Use CHG Soap or wipes as directed on instruction sheet.  Do not wear jewelry, make-up, hairpins, clips or nail polish.  Do not wear lotions, powders, or perfumes.   Do not shave body from the neck down 48 hours prior to surgery just in case you cut yourself which could leave a site for infection.  Also, freshly shaved skin may become irritated if using the CHG soap.  Contact lenses, hearing aids and dentures may not be worn into surgery.  Do not bring valuables to the hospital. [facility] is not responsible for any missing/lost belongings or valuables.   Notify your doctor if there is any change in your medical condition (cold, fever, infection).  Wear comfortable clothing (specific to your surgery type) to the hospital.  After surgery, you can help prevent lung complications by doing breathing exercises.  Take deep breaths and cough every 1-2 hours. Your doctor may order a device called an Incentive Spirometer to help you take deep breaths. When coughing or sneezing, hold a pillow firmly against your incision with both hands. This is called "splinting." Doing this helps protect your incision. It also decreases belly discomfort.  If you are being admitted to the hospital overnight, leave your suitcase in the car. After surgery it may be brought to your room.  If you are being discharged the day of surgery, you will not be allowed  to drive home. You will need a responsible adult (18 years or older) to drive you home and stay with you that night.   If you are taking public transportation, you will need to have a responsible adult (18 years or older) with you. Please confirm with your physician that it is acceptable to use public transportation.   Please call the [facility] Dept. at [phone] if you have any questions about these  instructions.  Surgery Visitation Policy:  Patients undergoing a surgery or procedure may have two family members or support persons with them as long as the person is not COVID-19 positive or experiencing its symptoms.   Inpatient Visitation:    Visiting hours are 7 a.m. to 8 p.m. Up to four visitors are allowed at one time in a patient room. The visitors may rotate out with other people during the day. One designated support person (adult) may remain overnight.  Due to an increase in RSV and influenza rates and associated hospitalizations, children ages 73 and under will not be able to visit patients in [facility]. Masks continue to be strongly recommended.

## 2022-02-01 ENCOUNTER — Encounter
Admission: RE | Admit: 2022-02-01 | Discharge: 2022-02-01 | Disposition: A | Discharge status: Home / Self Care | Payer: Medicare Other | Source: Ambulatory Visit | Attending: Surgery | Admitting: Surgery

## 2022-02-01 VITALS — Ht 70.0 in | Wt 160.0 lb

## 2022-02-01 DIAGNOSIS — I10 Essential (primary) hypertension: Secondary | ICD-10-CM

## 2022-02-01 DIAGNOSIS — E119 Type 2 diabetes mellitus without complications: Secondary | ICD-10-CM

## 2022-02-01 NOTE — Patient Instructions (Signed)
Your procedure is scheduled on: Friday February 03, 2022. Report to Day Surgery inside [facility] 2nd floor, stop by registration desk before getting on elevator. To find out your arrival time please call [phone] between 1PM - 3PM on Thursday February 02, 2022.  Remember: Instructions that are not followed completely may result in serious medical risk,  up to and including death, or upon the discretion of your surgeon and anesthesiologist your  surgery may need to be rescheduled.     _X__ 1. Do not eat food after midnight the night before your procedure.                 No chewing gum or hard candies. You may drink clear liquids up to 2 hours                 before you are scheduled to arrive for your surgery- DO not drink clear                 liquids within 2 hours of the start of your surgery.                 Clear Liquids include:  water, apple juice without pulp, clear Gatorade, G2 or                  Gatorade Zero (avoid Red/Purple/Blue), Black Coffee or Tea (Do not add                 anything to coffee or tea).  __X__2.  On the morning of surgery brush your teeth with toothpaste and water, you                may rinse your mouth with mouthwash if you wish.  Do not swallow any toothpaste or mouthwash.     _X__ 3.  No Alcohol for 24 hours before or after surgery.   _X__ 4.  Do Not Smoke or use e-cigarettes For 24 Hours Prior to Your Surgery.                 Do not use any chewable tobacco products for at least 6 hours prior to                 Surgery.  _X__  5.  Do not use any recreational drugs (marijuana, cocaine, heroin, ecstasy, MDMA or other)                For at least one week prior to your surgery.  Combination of these drugs with anesthesia                May have life threatening results.  ____  6.  Bring all medications with you on the day of surgery if instructed.   __X__  7.  Notify your doctor if there is any change in your medical  condition      (cold, fever, infections).     Do not wear jewelry, make-up, hairpins, clips or nail polish. Do not wear lotions, powders, or perfumes. You may wear deodorant. Do not shave 48 hours prior to surgery. Men may shave face and neck. Do not bring valuables to the hospital.    [facility] is not responsible for any belongings or valuables.  Contacts, dentures or bridgework may not be worn into surgery. Leave your suitcase in the car. After surgery it may be brought to your room. For patients admitted to the hospital, discharge time is determined  by your treatment team.   Patients discharged the day of surgery will not be allowed to drive home.   Make arrangements for someone to be with you for the first 24 hours of your Same Day Discharge.     __X__ Take these medicines the morning of surgery with A SIP OF WATER:    1. citalopram (CELEXA) 10 MG   2. pravastatin (PRAVACHOL) 40 MG   3.   4.  5.  6.  ____ Fleet Enema (as directed)   __X__ Use CHG Soap (or wipes) as directed  ____ Use Benzoyl Peroxide Gel as instructed  ____ Use inhalers on the day of surgery  ____ Stop metformin 2 days prior to surgery    ____ Take 1/2 of usual insulin dose the night before surgery. No insulin the morning          of surgery.   ____ Call your PCP, cardiologist, or Pulmonologist if taking Coumadin/Plavix/aspirin and ask when to stop before your surgery.   __X__ One Week prior to surgery- Stop Anti-inflammatories such as Ibuprofen, Aleve, Advil, Motrin, meloxicam (MOBIC), diclofenac, etodolac, ketorolac, Toradol, Daypro, piroxicam, Goody's or BC powders. OK TO USE TYLENOL IF NEEDED   __X__ Stop supplements until after surgery.    ____ Bring C-Pap to the hospital.    If you have any questions regarding your pre-procedure instructions,  Please call Pre-admit Testing at [phone]    Preparing for Surgery with CHLORHEXIDINE GLUCONATE (CHG) Soap  Chlorhexidine Gluconate  (CHG) Soap  o An antiseptic cleaner that kills germs and bonds with the skin to continue killing germs even after washing  o Used for showering the night before surgery and morning of surgery  Before surgery, you can play an important role by reducing the number of germs on your skin.  CHG (Chlorhexidine gluconate) soap is an antiseptic cleanser which kills germs and bonds with the skin to continue killing germs even after washing.  Please do not use if you have an allergy to CHG or antibacterial soaps. If your skin becomes reddened/irritated stop using the CHG.  1. Shower the NIGHT BEFORE SURGERY and the MORNING OF SURGERY with CHG soap.  2. If you choose to wash your hair, wash your hair first as usual with your normal shampoo.  3. After shampooing, rinse your hair and body thoroughly to remove the shampoo.  4. Use CHG as you would any other liquid soap. You can apply CHG directly to the skin and wash gently with a scrungie or a clean washcloth.  5. Apply the CHG soap to your body only from the neck down. Do not use on open wounds or open sores. Avoid contact with your eyes, ears, mouth, and genitals (private parts). Wash face and genitals (private parts) with your normal soap.  6. Wash thoroughly, paying special attention to the area where your surgery will be performed.  7. Thoroughly rinse your body with warm water.  8. Do not shower/wash with your normal soap after using and rinsing off the CHG soap.  9. Pat yourself dry with a clean towel.  10. Wear clean pajamas to bed the night before surgery.  12. Place clean sheets on your bed the night of your first shower and do not sleep with pets.  13. Shower again with the CHG soap on the day of surgery prior to arriving at the hospital.  14. Do not apply any deodorants/lotions/powders.  15. Please wear clean clothes to the hospital.

## 2022-02-02 ENCOUNTER — Encounter
Admission: RE | Admit: 2022-02-02 | Discharge: 2022-02-02 | Disposition: A | Discharge status: Home / Self Care | Payer: Medicare Other | Source: Ambulatory Visit | Attending: Surgery | Admitting: Surgery

## 2022-02-02 DIAGNOSIS — I10 Essential (primary) hypertension: Secondary | ICD-10-CM

## 2022-02-02 DIAGNOSIS — E119 Type 2 diabetes mellitus without complications: Secondary | ICD-10-CM | POA: Insufficient documentation

## 2022-02-02 DIAGNOSIS — Z01818 Encounter for other preprocedural examination: Secondary | ICD-10-CM | POA: Insufficient documentation

## 2022-02-02 LAB — CBC
HCT: 42 % (ref 39.0–52.0)
Hemoglobin: 14.3 g/dL (ref 13.0–17.0)
MCH: 31 pg (ref 26.0–34.0)
MCHC: 34 g/dL (ref 30.0–36.0)
MCV: 90.9 fL (ref 80.0–100.0)
Platelets: 216 10*3/uL (ref 150–400)
RBC: 4.62 MIL/uL (ref 4.22–5.81)
RDW: 12.9 % (ref 11.5–15.5)
WBC: 6.8 10*3/uL (ref 4.0–10.5)
nRBC: 0 % (ref 0.0–0.2)

## 2022-02-02 LAB — BASIC METABOLIC PANEL
Anion gap: 6 (ref 5–15)
BUN: 18 mg/dL (ref 8–23)
CO2: 30 mmol/L (ref 22–32)
Calcium: 9.1 mg/dL (ref 8.9–10.3)
Chloride: 99 mmol/L (ref 98–111)
Creatinine, Ser: 1.02 mg/dL (ref 0.61–1.24)
GFR, Estimated: >60 mL/min (ref >60)
Glucose, Bld: 110 mg/dL — ABNORMAL HIGH (ref 70–99)
Potassium: 3.9 mmol/L (ref 3.5–5.1)
Sodium: 135 mmol/L (ref 135–145)

## 2022-02-02 MED ORDER — ORAL CARE MOUTH RINSE: 15.0000 mL | Freq: Once | OROMUCOSAL | Status: DC

## 2022-02-02 MED ORDER — GABAPENTIN 300 MG PO CAPS: 300.0000 mg | ORAL_CAPSULE | ORAL | Status: AC

## 2022-02-02 MED ORDER — CEFAZOLIN SODIUM-DEXTROSE 2-4 GM/100ML-% IV SOLN
2.0000 g | INTRAVENOUS | Status: AC
  Administered 2022-02-03: 2 g via INTRAVENOUS

## 2022-02-02 MED ORDER — FAMOTIDINE 20 MG PO TABS: 20.0000 mg | ORAL_TABLET | Freq: Once | ORAL | Status: AC

## 2022-02-02 MED ORDER — CHLORHEXIDINE GLUCONATE 0.12 % MT SOLN: 15.0000 mL | Freq: Once | OROMUCOSAL | Status: DC

## 2022-02-02 MED ORDER — CHLORHEXIDINE GLUCONATE CLOTH 2 % EX PADS: 6.0000 | MEDICATED_PAD | Freq: Once | CUTANEOUS | Status: DC

## 2022-02-02 MED ORDER — SODIUM CHLORIDE 0.9 % IV SOLN: INTRAVENOUS | Status: DC

## 2022-02-02 MED ORDER — CELECOXIB 200 MG PO CAPS: 200.0000 mg | ORAL_CAPSULE | ORAL | Status: AC

## 2022-02-02 MED ORDER — ACETAMINOPHEN 500 MG PO TABS: 1000.0000 mg | ORAL_TABLET | ORAL | Status: AC

## 2022-02-03 ENCOUNTER — Encounter: Payer: Self-pay | Admitting: Surgery

## 2022-02-03 ENCOUNTER — Ambulatory Visit: Payer: Medicare Other | Admitting: Urgent Care

## 2022-02-03 ENCOUNTER — Ambulatory Visit: Payer: Medicare Other | Admitting: Certified Registered"

## 2022-02-03 ENCOUNTER — Ambulatory Visit
Admission: RE | Admit: 2022-02-03 | Discharge: 2022-02-03 | Disposition: A | Discharge status: Home / Self Care | Payer: Medicare Other | Attending: Surgery | Admitting: Surgery

## 2022-02-03 ENCOUNTER — Other Ambulatory Visit: Payer: Self-pay

## 2022-02-03 ENCOUNTER — Encounter
Admission: RE | Disposition: A | Discharge status: Home / Self Care | Payer: Self-pay | Source: Home / Self Care | Attending: Surgery

## 2022-02-03 DIAGNOSIS — K4090 Unilateral inguinal hernia, without obstruction or gangrene, not specified as recurrent: Secondary | ICD-10-CM | POA: Diagnosis present

## 2022-02-03 DIAGNOSIS — I10 Essential (primary) hypertension: Secondary | ICD-10-CM | POA: Diagnosis not present

## 2022-02-03 DIAGNOSIS — E119 Type 2 diabetes mellitus without complications: Secondary | ICD-10-CM

## 2022-02-03 DIAGNOSIS — Z87891 Personal history of nicotine dependence: Secondary | ICD-10-CM | POA: Insufficient documentation

## 2022-02-03 DIAGNOSIS — Z79899 Other long term (current) drug therapy: Secondary | ICD-10-CM | POA: Diagnosis not present

## 2022-02-03 DIAGNOSIS — J449 Chronic obstructive pulmonary disease, unspecified: Secondary | ICD-10-CM | POA: Insufficient documentation

## 2022-02-03 HISTORY — PX: XI ROBOTIC ASSISTED INGUINAL HERNIA REPAIR WITH MESH: SHX6706

## 2022-02-03 SURGERY — REPAIR, HERNIA, INGUINAL, ROBOT-ASSISTED, LAPAROSCOPIC, USING MESH
Anesthesia: General | Site: Groin | Laterality: Left

## 2022-02-03 MED ORDER — CHLORHEXIDINE GLUCONATE 0.12 % MT SOLN
OROMUCOSAL | Status: AC
  Filled 2022-02-03: qty 15

## 2022-02-03 MED ORDER — CELECOXIB 200 MG PO CAPS
ORAL_CAPSULE | ORAL | Status: AC
  Administered 2022-02-03: 200 mg via ORAL
  Filled 2022-02-03: qty 1

## 2022-02-03 MED ORDER — HYDROMORPHONE HCL 1 MG/ML IJ SOLN
INTRAMUSCULAR | Status: AC
  Filled 2022-02-03: qty 1

## 2022-02-03 MED ORDER — DOCUSATE SODIUM 100 MG PO CAPS: 100.0000 mg | ORAL_CAPSULE | Freq: Two times a day (BID) | ORAL | 0 refills | Status: AC | PRN

## 2022-02-03 MED ORDER — PHENYLEPHRINE 80 MCG/ML (10ML) SYRINGE FOR IV PUSH (FOR BLOOD PRESSURE SUPPORT)
PREFILLED_SYRINGE | INTRAVENOUS | Status: AC
  Filled 2022-02-03: qty 30

## 2022-02-03 MED ORDER — CEFAZOLIN SODIUM-DEXTROSE 2-4 GM/100ML-% IV SOLN
INTRAVENOUS | Status: AC
  Filled 2022-02-03: qty 100

## 2022-02-03 MED ORDER — HYDROMORPHONE HCL 1 MG/ML IJ SOLN
INTRAMUSCULAR | Status: DC | PRN
  Administered 2022-02-03: 1 mg via INTRAVENOUS

## 2022-02-03 MED ORDER — FENTANYL CITRATE (PF) 100 MCG/2ML IJ SOLN
INTRAMUSCULAR | Status: AC
  Filled 2022-02-03: qty 2

## 2022-02-03 MED ORDER — FENTANYL CITRATE (PF) 100 MCG/2ML IJ SOLN: 25.0000 ug | INTRAMUSCULAR | Status: DC | PRN

## 2022-02-03 MED ORDER — SEVOFLURANE IN SOLN
RESPIRATORY_TRACT | Status: AC
  Filled 2022-02-03: qty 250

## 2022-02-03 MED ORDER — GLYCOPYRROLATE 0.2 MG/ML IJ SOLN
INTRAMUSCULAR | Status: DC | PRN
  Administered 2022-02-03 (×2): .2 mg via INTRAVENOUS

## 2022-02-03 MED ORDER — ACETAMINOPHEN 325 MG PO TABS: 650.0000 mg | ORAL_TABLET | Freq: Three times a day (TID) | ORAL | 0 refills | Status: AC | PRN

## 2022-02-03 MED ORDER — PHENYLEPHRINE HCL (PRESSORS) 10 MG/ML IV SOLN
INTRAVENOUS | Status: AC
  Filled 2022-02-03: qty 1

## 2022-02-03 MED ORDER — ONDANSETRON HCL 4 MG/2ML IJ SOLN: 4.0000 mg | Freq: Once | INTRAMUSCULAR | Status: DC | PRN

## 2022-02-03 MED ORDER — ROCURONIUM BROMIDE 100 MG/10ML IV SOLN
INTRAVENOUS | Status: DC | PRN
  Administered 2022-02-03: 40 mg via INTRAVENOUS

## 2022-02-03 MED ORDER — BUPIVACAINE LIPOSOME 1.3 % IJ SUSP
INTRAMUSCULAR | Status: AC
  Filled 2022-02-03: qty 20

## 2022-02-03 MED ORDER — BUPIVACAINE LIPOSOME 1.3 % IJ SUSP
INTRAMUSCULAR | Status: DC | PRN
  Administered 2022-02-03: 20 mL

## 2022-02-03 MED ORDER — SUGAMMADEX SODIUM 200 MG/2ML IV SOLN
INTRAVENOUS | Status: DC | PRN
  Administered 2022-02-03: 200 mg via INTRAVENOUS

## 2022-02-03 MED ORDER — PROPOFOL 10 MG/ML IV BOLUS
INTRAVENOUS | Status: DC | PRN
  Administered 2022-02-03: 150 mg via INTRAVENOUS

## 2022-02-03 MED ORDER — OXYCODONE HCL 5 MG PO TABS
ORAL_TABLET | ORAL | Status: AC
  Administered 2022-02-03: 5 mg via ORAL
  Filled 2022-02-03: qty 1

## 2022-02-03 MED ORDER — HYDROCODONE-ACETAMINOPHEN 5-325 MG PO TABS: 1.0000 | ORAL_TABLET | Freq: Four times a day (QID) | ORAL | 0 refills | Status: DC | PRN

## 2022-02-03 MED ORDER — FAMOTIDINE 20 MG PO TABS
ORAL_TABLET | ORAL | Status: AC
  Administered 2022-02-03: 20 mg via ORAL
  Filled 2022-02-03: qty 1

## 2022-02-03 MED ORDER — PHENYLEPHRINE 80 MCG/ML (10ML) SYRINGE FOR IV PUSH (FOR BLOOD PRESSURE SUPPORT)
PREFILLED_SYRINGE | INTRAVENOUS | Status: DC | PRN
  Administered 2022-02-03: 160 ug via INTRAVENOUS

## 2022-02-03 MED ORDER — OXYCODONE HCL 5 MG PO TABS: 5.0000 mg | ORAL_TABLET | Freq: Once | ORAL | Status: AC

## 2022-02-03 MED ORDER — ACETAMINOPHEN 500 MG PO TABS
ORAL_TABLET | ORAL | Status: AC
  Administered 2022-02-03: 1000 mg via ORAL
  Filled 2022-02-03: qty 2

## 2022-02-03 MED ORDER — BUPIVACAINE-EPINEPHRINE 0.5% -1:200000 IJ SOLN
INTRAMUSCULAR | Status: DC | PRN
  Administered 2022-02-03: 10 mL

## 2022-02-03 MED ORDER — DEXAMETHASONE SODIUM PHOSPHATE 10 MG/ML IJ SOLN
INTRAMUSCULAR | Status: DC | PRN
  Administered 2022-02-03: 5 mg via INTRAVENOUS

## 2022-02-03 MED ORDER — LIDOCAINE HCL (CARDIAC) PF 100 MG/5ML IV SOSY
PREFILLED_SYRINGE | INTRAVENOUS | Status: DC | PRN
  Administered 2022-02-03: 100 mg via INTRAVENOUS

## 2022-02-03 MED ORDER — ONDANSETRON HCL 4 MG/2ML IJ SOLN
INTRAMUSCULAR | Status: DC | PRN
  Administered 2022-02-03 (×2): 4 mg via INTRAVENOUS

## 2022-02-03 MED ORDER — ESMOLOL HCL 100 MG/10ML IV SOLN
INTRAVENOUS | Status: DC | PRN
  Administered 2022-02-03: 10 mg via INTRAVENOUS

## 2022-02-03 MED ORDER — FENTANYL CITRATE (PF) 100 MCG/2ML IJ SOLN
INTRAMUSCULAR | Status: DC | PRN
  Administered 2022-02-03 (×2): 50 ug via INTRAVENOUS

## 2022-02-03 MED ORDER — PHENYLEPHRINE 80 MCG/ML (10ML) SYRINGE FOR IV PUSH (FOR BLOOD PRESSURE SUPPORT)
PREFILLED_SYRINGE | INTRAVENOUS | Status: AC
  Filled 2022-02-03: qty 10

## 2022-02-03 MED ORDER — BUPIVACAINE-EPINEPHRINE (PF) 0.5% -1:200000 IJ SOLN
INTRAMUSCULAR | Status: AC
  Filled 2022-02-03: qty 30

## 2022-02-03 MED ORDER — GABAPENTIN 300 MG PO CAPS
ORAL_CAPSULE | ORAL | Status: AC
  Administered 2022-02-03: 300 mg via ORAL
  Filled 2022-02-03: qty 1

## 2022-02-03 MED ORDER — EPHEDRINE SULFATE (PRESSORS) 50 MG/ML IJ SOLN
INTRAMUSCULAR | Status: DC | PRN
  Administered 2022-02-03 (×2): 5 mg via INTRAVENOUS

## 2022-02-03 MED ORDER — MIDAZOLAM HCL 2 MG/2ML IJ SOLN
INTRAMUSCULAR | Status: AC
  Filled 2022-02-03: qty 2

## 2022-02-03 SURGICAL SUPPLY — 50 items
ADH SKN CLS APL DERMABOND .7 (GAUZE/BANDAGES/DRESSINGS) ×1
BAG PRESSURE INF REUSE 1000 (BAG) IMPLANT
BLADE SURG SZ11 CARB STEEL (BLADE) ×1 IMPLANT
BNDG GAUZE DERMACEA FLUFF 4 (GAUZE/BANDAGES/DRESSINGS) ×1 IMPLANT
BNDG GZE DERMACEA 4 6PLY (GAUZE/BANDAGES/DRESSINGS) ×1
COVER TIP SHEARS 8 DVNC (MISCELLANEOUS) ×1 IMPLANT
COVER TIP SHEARS 8MM DA VINCI (MISCELLANEOUS) ×1
COVER WAND RF STERILE (DRAPES) ×1 IMPLANT
DERMABOND ADVANCED .7 DNX12 (GAUZE/BANDAGES/DRESSINGS) ×1 IMPLANT
DRAPE ARM DVNC X/XI (DISPOSABLE) ×3 IMPLANT
DRAPE COLUMN DVNC XI (DISPOSABLE) ×1 IMPLANT
DRAPE DA VINCI XI ARM (DISPOSABLE) ×3
DRAPE DA VINCI XI COLUMN (DISPOSABLE) ×1
ELECT CAUTERY BLADE 6.4 (BLADE) IMPLANT
ELECT REM PT RETURN 9FT ADLT (ELECTROSURGICAL) ×1
ELECTRODE REM PT RTRN 9FT ADLT (ELECTROSURGICAL) ×1 IMPLANT
GLOVE BIOGEL PI IND STRL 7.0 (GLOVE) ×2 IMPLANT
GLOVE SURG SYN 6.5 ES PF (GLOVE) ×5 IMPLANT
GLOVE SURG SYN 6.5 PF PI (GLOVE) ×2 IMPLANT
GOWN STRL REUS W/ TWL LRG LVL3 (GOWN DISPOSABLE) ×3 IMPLANT
GOWN STRL REUS W/TWL LRG LVL3 (GOWN DISPOSABLE) ×5
IRRIGATOR SUCT 8 DISP DVNC XI (IRRIGATION / IRRIGATOR) IMPLANT
IRRIGATOR SUCTION 8MM XI DISP (IRRIGATION / IRRIGATOR)
IV NS 1000ML (IV SOLUTION)
IV NS 1000ML BAXH (IV SOLUTION) IMPLANT
LABEL OR SOLS (LABEL) IMPLANT
MANIFOLD NEPTUNE II (INSTRUMENTS) ×1 IMPLANT
MESH 3DMAX MID 4X6 LT LRG (Mesh General) IMPLANT
NDL INSUFFLATION 14GA 120MM (NEEDLE) ×1 IMPLANT
NEEDLE HYPO 22GX1.5 SAFETY (NEEDLE) ×1 IMPLANT
NEEDLE INSUFFLATION 14GA 120MM (NEEDLE) ×1 IMPLANT
OBTURATOR OPTICAL STANDARD 8MM (TROCAR) ×1
OBTURATOR OPTICAL STND 8 DVNC (TROCAR) ×1
OBTURATOR OPTICALSTD 8 DVNC (TROCAR) ×1 IMPLANT
PACK LAP CHOLECYSTECTOMY (MISCELLANEOUS) ×1 IMPLANT
PENCIL SMOKE EVACUATOR (MISCELLANEOUS) ×1 IMPLANT
SEAL CANN UNIV 5-8 DVNC XI (MISCELLANEOUS) ×3 IMPLANT
SEAL XI 5MM-8MM UNIVERSAL (MISCELLANEOUS) ×3
SET TUBE SMOKE EVAC HIGH FLOW (TUBING) ×1 IMPLANT
SOLUTION ELECTROLUBE (MISCELLANEOUS) ×1 IMPLANT
SUT MNCRL 4-0 (SUTURE) ×1
SUT MNCRL 4-0 27XMFL (SUTURE) ×1
SUT VIC AB 2-0 SH 27 (SUTURE) ×1
SUT VIC AB 2-0 SH 27XBRD (SUTURE) ×1 IMPLANT
SUT VLOC 90 6 CV-15 VIOLET (SUTURE) ×2 IMPLANT
SUTURE MNCRL 4-0 27XMF (SUTURE) ×1 IMPLANT
SYR 30ML LL (SYRINGE) ×1 IMPLANT
TAPE TRANSPORE STRL 2 31045 (GAUZE/BANDAGES/DRESSINGS) ×1 IMPLANT
TRAP FLUID SMOKE EVACUATOR (MISCELLANEOUS) ×1 IMPLANT
WATER STERILE IRR 500ML POUR (IV SOLUTION) ×1 IMPLANT

## 2022-02-03 NOTE — Anesthesia Procedure Notes (Signed)
Procedure Name: Intubation Date/Time: 02/03/2022 12:58 PM  Performed by: Kelton Pillar, CRNAPre-anesthesia Checklist: Patient identified, Emergency Drugs available, Suction available and Patient being monitored Patient Re-evaluated:Patient Re-evaluated prior to induction Oxygen Delivery Method: Circle system utilized Preoxygenation: Pre-oxygenation with 100% oxygen Induction Type: IV induction Ventilation: Mask ventilation without difficulty Laryngoscope Size: McGraph and 3 Grade View: Grade I Tube type: Oral Tube size: 7.0 mm Number of attempts: 1 Airway Equipment and Method: Stylet and Oral airway Placement Confirmation: ETT inserted through vocal cords under direct vision, positive ETCO2, breath sounds checked- equal and bilateral and CO2 detector Secured at: 21 cm Tube secured with: Tape Dental Injury: Teeth and Oropharynx as per pre-operative assessment

## 2022-02-03 NOTE — Op Note (Signed)
Preoperative diagnosis: left, initial, reducible inguinal Hernia.  Postoperative diagnosis: left inguinal Hernia  Procedure: Robotic assisted laparoscopic left inguinal hernia repair with mesh  Anesthesia: General  Surgeon: Dr. Lysle Pearl  Wound Classification: Clean  Specimen: none  Complications: None  Estimated Blood Loss: 64m   Indications:  inguinal hernia. Repair was indicated to avoid complications of incarceration, obstruction and pain, and a prosthetic mesh repair was elected.  See H&P for further details.  Findings: 1. Vas Deferens and cord structures identified and preserved 2. Bard 3D max medium weight mesh used for repair 3. Adequate hemostasis achieved  Description of procedure: The patient was taken to the operating room. A time-out was completed verifying correct patient, procedure, site, positioning, and implant(s) and/or special equipment prior to beginning this procedure.  Area was prepped and draped in the usual sterile fashion. An incision was marked 20 cm above the pubic tubercle, slightly above the umbilicus  Scrotum wrapped in Kerlix roll.  Veress needle inserted at palmer's point.  Saline drop test noted to be positive with gradual increase in pressure after initiation of gas insufflation.  15 mm of pressure was achieved prior to removing the Veress needle and then placing a 8 mm port via the Optiview technique through the supraumbilical site.  Inspection of the area afterwards noted no injury to the surrounding organs during insertion of the needle and the port.  2 port sites were marked 8 cm to the lateral sides of the initial port, and a 8 mm robotic port was placed on the left side, another 8 mm robotic port on the right side under direct supervision.  Local anesthesia  infused to the preplanned incision sites prior to insertion of the port.  The [place] then brought into the operative field and docked to the ports.  Examination of the  abdominal cavity noted a left inguinal hernia.  A peritoneal flap was created approximately 8cm cephalad to the defect by using scissors with electrocautery.  Dissection was carried down towards the pubic tubercle, developing the myopectineal orifice view.  Laterally the flap was carried towards the ASIS.  Small hernia sac and large lipoma was noted, which carefully dissected away from the adjacent tissues to be fully reduced out of hernia cavity.  Any bleeding was controlled with combination of electrocautery and manual pressure.    After confirming adequate dissection and the peritoneal reflection completely down and away from the cord structures, a Large Bard 3DMax medium weight mesh was placed within the anterior abdominal wall, secured in place using 2-0 Vicryl on an SH needle immediately above the pubic tubercle.  After noting proper placement of the mesh with the peritoneal reflection deep to it, the previously created peritoneal flap was secured back up to the anterior abdominal wall using running 3-0 V-Lock, lipoma was included into closure to keep it out of canal.  Both needles were then removed out of the abdominal cavity, Xi platform undocked from the ports and removed off of operative field.  exparel infused as ilioinguinal block.  Abdomen then desufflated and ports removed. All the skin incisions were then closed with a subcuticular stitch of Monocryl 4-0. Dermabond was applied. The testis was gently pulled down into its anatomic position in the scrotum.  The patient tolerated the procedure well and was taken to the postanesthesia care unit in stable condition. Sponge and instrument count correct at end of procedure.

## 2022-02-03 NOTE — Discharge Instructions (Addendum)
AMBULATORY SURGERY  DISCHARGE INSTRUCTIONS   Hernia repair, Care After This sheet gives you information about how to care for yourself after your procedure. Your health care provider may also give you more specific instructions. If you have problems or questions, contact your health care provider. What can I expect after the procedure? After your procedure, it is common to have the following: Pain in your abdomen, especially in the incision areas. You will be given medicine to control the pain. Tiredness. This is a normal part of the recovery process. Your energy level will return to normal over the next several weeks. Changes in your bowel movements, such as constipation or needing to go more often. Talk with your health care provider about how to manage this. Follow these instructions at home: Medicines  tylenol as needed for discomfort.   Use narcotics, if prescribed, only when tylenol is not enough to control pain.  325-'650mg'$  every 8hrs to max of '3000mg'$ /24hrs (including the '325mg'$  in every norco dose) for the tylenol.    PLEASE RECORD NUMBER OF PILLS TAKEN UNTIL NEXT FOLLOW UP APPT.  THIS WILL HELP DETERMINE HOW READY YOU ARE TO BE RELEASED FROM ANY ACTIVITY RESTRICTIONS Do not drive or use heavy machinery while taking prescription pain medicine. Do not drink alcohol while taking prescription pain medicine.  Incision care    Follow instructions from your health care provider about how to take care of your incision areas. Make sure you: Keep your incisions clean and dry. Wash your hands with soap and water before and after applying medicine to the areas, and before and after changing your bandage (dressing). If soap and water are not available, use hand sanitizer. Change your dressing as told by your health care provider. Leave stitches (sutures), skin glue, or adhesive strips in place. These skin closures may need to stay in place for 2 weeks or longer. If adhesive strip edges start to  loosen and curl up, you may trim the loose edges. Do not remove adhesive strips completely unless your health care provider tells you to do that. Do not wear tight clothing over the incisions. Tight clothing may rub and irritate the incision areas, which may cause the incisions to open. Do not take baths, swim, or use a hot tub until your health care provider approves. OK TO SHOWER IN 24HRS.   Check your incision area every day for signs of infection. Check for: More redness, swelling, or pain. More fluid or blood. Warmth. Pus or a bad smell. Activity Avoid lifting anything that is heavier than 10 lb (4.5 kg) for 2 weeks or until your health care provider says it is okay. No pushing/pulling greater than 30lbs You may resume normal activities as told by your health care provider. Ask your health care provider what activities are safe for you. Take rest breaks during the day as needed. Eating and drinking Follow instructions from your health care provider about what you can eat after surgery. To prevent or treat constipation while you are taking prescription pain medicine, your health care provider may recommend that you: Drink enough fluid to keep your urine clear or pale yellow. Take over-the-counter or prescription medicines. Eat foods that are high in fiber, such as fresh fruits and vegetables, whole grains, and beans. Limit foods that are high in fat and processed sugars, such as fried and sweet foods. General instructions Ask your health care provider when you will need an appointment to get your sutures or staples removed. Keep all follow-up  visits as told by your health care provider. This is important. Contact a health care provider if: You have more redness, swelling, or pain around your incisions. You have more fluid or blood coming from the incisions. Your incisions feel warm to the touch. You have pus or a bad smell coming from your incisions or your dressing. You have a  fever. You have an incision that breaks open (edges not staying together) after sutures or staples have been removed. You develop a rash. You have chest pain or difficulty breathing. You have pain or swelling in your legs. You feel light-headed or you faint. Your abdomen swells (becomes distended). You have nausea or vomiting. You have blood in your stool (feces). This information is not intended to replace advice given to you by your health care provider. Make sure you discuss any questions you have with your health care provider. Document Released: 07/15/2004 Document Revised: 09/14/2017 Document Reviewed: 09/27/2015 Elsevier Interactive Patient Education  [employer].    The drugs that you were given will stay in your system until tomorrow so for the next 24 hours you should not:  Drive an automobile Make any legal decisions Drink any alcoholic beverage   You may resume regular meals tomorrow.  Today it is better to start with liquids and gradually work up to solid foods.  You may eat anything you prefer, but it is better to start with liquids, then soup and crackers, and gradually work up to solid foods.   Please notify your doctor immediately if you have any unusual bleeding, trouble breathing, redness and pain at the surgery site, drainage, fever, or pain not relieved by medication.    Additional Instructions:        Please contact your physician with any problems or Same Day Surgery at [phone], Monday through Friday 6 am to 4 pm, or [place] at [facility] number at [phone].

## 2022-02-03 NOTE — Interval H&P Note (Signed)
No change. OK to proceed.

## 2022-02-03 NOTE — Anesthesia Preprocedure Evaluation (Addendum)
Anesthesia Evaluation  Patient identified by MRN, date of birth, ID band Patient awake    Reviewed: Allergy & Precautions, NPO status , Patient's Chart, lab work & pertinent test results  Airway Mallampati: III  TM Distance: >3 FB Neck ROM: Full    Dental  (+) Upper Dentures   Pulmonary COPD, Patient abstained from smoking., former smoker   Pulmonary exam normal breath sounds clear to auscultation       Cardiovascular Exercise Tolerance: Good hypertension, Pt. on medications Normal cardiovascular exam Rhythm:Regular Rate:Normal     Neuro/Psych negative neurological ROS  negative psych ROS   GI/Hepatic negative GI ROS, Neg liver ROS,GERD  Medicated,,  Endo/Other  negative endocrine ROSdiabetes, Type 2    Renal/GU negative Renal ROS  negative genitourinary   Musculoskeletal   Abdominal Normal abdominal exam  (+)   Peds negative pediatric ROS (+)  Hematology negative hematology ROS (+)   Anesthesia Other Findings Past Medical History: No date: Aortic atherosclerosis (HCC) No date: Arthritis     Comment:  knee and hands No date: COPD (chronic obstructive pulmonary disease) (HCC)     Comment:  no inhalers No date: GERD (gastroesophageal reflux disease)     Comment:  occ No date: High cholesterol No date: Hypertension  Past Surgical History: No date: COLONOSCOPY 02/18/2021: INGUINAL HERNIA REPAIR; Right     Comment:  Procedure: HERNIA REPAIR INGUINAL ADULT WITH MESH;                Surgeon: Benjamine Sprague, DO;  Location: [facility] ORS;  Service:              General;  Laterality: Right; No date: WISDOM TOOTH EXTRACTION 06/13/2019: XI ROBOTIC ASSISTED INGUINAL HERNIA REPAIR WITH MESH;  Right     Comment:  Procedure: XI ROBOTIC ASSISTED INGUINAL HERNIA REPAIR               WITH MESH;  Surgeon: Benjamine Sprague, DO;  Location: [facility]               ORS;  Service: General;  Laterality: Right;  BMI    Body Mass Index: 22.96  kg/m      Reproductive/Obstetrics negative OB ROS                              Anesthesia Physical Anesthesia Plan  ASA: 2  Anesthesia Plan: General   Post-op Pain Management:    Induction:   PONV Risk Score and Plan: Ondansetron, Dexamethasone, Midazolam and Treatment may vary due to age or medical condition  Airway Management Planned: Oral ETT  Additional Equipment:   Intra-op Plan:   Post-operative Plan: Extubation in OR  Informed Consent: I have reviewed the patients History and Physical, chart, labs and discussed the procedure including the risks, benefits and alternatives for the proposed anesthesia with the patient or authorized representative who has indicated his/her understanding and acceptance.     Dental Advisory Given  Plan Discussed with: CRNA and Surgeon  Anesthesia Plan Comments:         Anesthesia Quick Evaluation

## 2022-02-03 NOTE — Anesthesia Postprocedure Evaluation (Signed)
Anesthesia Post Note  Patient: Terry Sheppard  Procedure(s) Performed: XI ROBOTIC ASSISTED INGUINAL HERNIA REPAIR WITH MESH (Left: Groin)  Patient location during evaluation: PACU Anesthesia Type: General Level of consciousness: awake Pain management: satisfactory to patient Vital Signs Assessment: post-procedure vital signs reviewed and stable Respiratory status: spontaneous breathing and nonlabored ventilation Cardiovascular status: stable Anesthetic complications: no  No notable events documented.   Last Vitals:  Vitals:   02/03/22 1434 02/03/22 1445  BP: 136/78 123/62  Pulse: 84 84  Resp: 16 (!) 8  Temp: 36.9 C   SpO2: 100% 100%    Last Pain:  Vitals:   02/03/22 1434  TempSrc:   PainSc: Asleep                 VAN STAVEREN,Heide Brossart

## 2022-02-03 NOTE — Transfer of Care (Signed)
Immediate Anesthesia Transfer of Care Note  Patient: Terry Sheppard  Procedure(s) Performed: XI ROBOTIC ASSISTED INGUINAL HERNIA REPAIR WITH MESH (Left: Groin)  Patient Location: PACU  Anesthesia Type:General  Level of Consciousness: awake, drowsy, and patient cooperative  Airway & Oxygen Therapy: Patient Spontanous Breathing and Patient connected to face mask oxygen  Post-op Assessment: Report given to RN and Post -op Vital signs reviewed and stable  Post vital signs: Reviewed and stable  Last Vitals:  Vitals Value Taken Time  BP 136/78 02/03/22 1434  Temp 36.9 C 02/03/22 1434  Pulse 81 02/03/22 1437  Resp 11 02/03/22 1437  SpO2 100 % 02/03/22 1437  Vitals shown include unvalidated device data.  Last Pain:  Vitals:   02/03/22 1434  TempSrc:   PainSc: Asleep         Complications: No notable events documented.

## 2022-02-03 NOTE — Progress Notes (Signed)
Patient has 23 cc on the bladder scan, he wants to go home. Notified Dr. Lysle Pearl notified and says is okay to go home, but to return to the ER  if unable to void. Instructed the patient and neighbor these instructions if unable to void at home.

## 2022-02-04 ENCOUNTER — Emergency Department
Admission: EM | Admit: 2022-02-04 | Discharge: 2022-02-04 | Disposition: A | Discharge status: Home / Self Care | Payer: Medicare Other | Attending: Emergency Medicine | Admitting: Emergency Medicine

## 2022-02-04 ENCOUNTER — Encounter: Payer: Self-pay | Admitting: *Deleted

## 2022-02-04 ENCOUNTER — Other Ambulatory Visit: Payer: Self-pay

## 2022-02-04 DIAGNOSIS — J449 Chronic obstructive pulmonary disease, unspecified: Secondary | ICD-10-CM | POA: Insufficient documentation

## 2022-02-04 DIAGNOSIS — R339 Retention of urine, unspecified: Secondary | ICD-10-CM | POA: Insufficient documentation

## 2022-02-04 DIAGNOSIS — R300 Dysuria: Secondary | ICD-10-CM | POA: Diagnosis not present

## 2022-02-04 DIAGNOSIS — I10 Essential (primary) hypertension: Secondary | ICD-10-CM | POA: Insufficient documentation

## 2022-02-04 LAB — COMPREHENSIVE METABOLIC PANEL
ALT: 13 U/L (ref 0–44)
AST: 31 U/L (ref 15–41)
Albumin: 3.8 g/dL (ref 3.5–5.0)
Alkaline Phosphatase: 61 U/L (ref 38–126)
Anion gap: 11 (ref 5–15)
BUN: 13 mg/dL (ref 8–23)
CO2: 24 mmol/L (ref 22–32)
Calcium: 8.6 mg/dL — ABNORMAL LOW (ref 8.9–10.3)
Chloride: 100 mmol/L (ref 98–111)
Creatinine, Ser: 0.99 mg/dL (ref 0.61–1.24)
GFR, Estimated: >60 mL/min (ref >60)
Glucose, Bld: 108 mg/dL — ABNORMAL HIGH (ref 70–99)
Potassium: 3 mmol/L — ABNORMAL LOW (ref 3.5–5.1)
Sodium: 135 mmol/L (ref 135–145)
Total Bilirubin: 0.8 mg/dL (ref 0.3–1.2)
Total Protein: 6.8 g/dL (ref 6.5–8.1)

## 2022-02-04 LAB — URINALYSIS, COMPLETE (UACMP) WITH MICROSCOPIC
Bacteria, UA: NONE SEEN
Bilirubin Urine: NEGATIVE
Glucose, UA: >=500 mg/dL — AB
Hgb urine dipstick: NEGATIVE
Ketones, ur: 5 mg/dL — AB
Leukocytes,Ua: NEGATIVE
Nitrite: NEGATIVE
Protein, ur: 30 mg/dL — AB
Specific Gravity, Urine: 1.026 (ref 1.005–1.030)
pH: 5 (ref 5.0–8.0)

## 2022-02-04 LAB — CBC
HCT: 38.9 % — ABNORMAL LOW (ref 39.0–52.0)
Hemoglobin: 13.4 g/dL (ref 13.0–17.0)
MCH: 31.5 pg (ref 26.0–34.0)
MCHC: 34.4 g/dL (ref 30.0–36.0)
MCV: 91.3 fL (ref 80.0–100.0)
Platelets: 233 10*3/uL (ref 150–400)
RBC: 4.26 MIL/uL (ref 4.22–5.81)
RDW: 12.9 % (ref 11.5–15.5)
WBC: 14 10*3/uL — ABNORMAL HIGH (ref 4.0–10.5)
nRBC: 0 % (ref 0.0–0.2)

## 2022-02-04 MED ORDER — SODIUM CHLORIDE 0.9 % IV BOLUS
1000.0000 mL | Freq: Once | INTRAVENOUS | Status: AC
  Administered 2022-02-04: 1000 mL via INTRAVENOUS

## 2022-02-04 MED ORDER — PHENAZOPYRIDINE HCL 200 MG PO TABS: 200.0000 mg | ORAL_TABLET | Freq: Three times a day (TID) | ORAL | 0 refills | Status: AC | PRN

## 2022-02-04 NOTE — ED Provider Notes (Signed)
[facility] Provider Note    Event Date/Time   First MD Initiated Contact with Patient 02/04/22 [phone]     (approximate)  History   Chief Complaint: Urinary Retention  HPI  Terry Sheppard is a 77 y.o. male with a past medical history of COPD, gastric reflux, hypertension, hyperlipidemia, presents to the emergency department for urinary retention.  According to the patient he had a hernia procedure performed yesterday, since going home he states he is only been able to urinate a very small amount he states a tablespoon or so at a time, and says some burning when he does so.  Patient states he has not urinated much at all yesterday or today so he came to the emergency department.  Patient denies any significant abdominal pain but did have a surgery performed yesterday.  No fever.  Physical Exam   Triage Vital Signs: ED Triage Vitals  Enc Vitals Group     BP 02/04/22 0926 (!) 144/71     Pulse Rate 02/04/22 0926 (!) 58     Resp 02/04/22 0926 18     Temp 02/04/22 0926 (!) 97.4 F (36.3 C)     Temp Source 02/04/22 0926 Oral     SpO2 02/04/22 0926 100 %     Weight 02/04/22 0927 163 lb (73.9 kg)     Height 02/04/22 0927 '5\' 11"'$  (1.803 m)     Head Circumference --      Peak Flow --      Pain Score 02/04/22 0926 1     Pain Loc --      Pain Edu? --      Excl. in [place]? --     Most recent vital signs: Vitals:   02/04/22 0926  BP: (!) 144/71  Pulse: (!) 58  Resp: 18  Temp: (!) 97.4 F (36.3 C)  SpO2: 100%    General: Awake, no distress.  CV:  Good peripheral perfusion.  Regular rate and rhythm  Resp:  Normal effort.  Equal breath sounds bilaterally.  Abd:  No distention.  Soft, largely not tender abdomen.  No bladder fullness palpated.  There is several small incisions with Dermabond, well-appearing, no dehiscence.   ED Results / Procedures / Treatments   MEDICATIONS ORDERED IN ED: Medications  sodium chloride 0.9 % bolus 1,000 mL (has no  administration in time range)     IMPRESSION / MDM / ASSESSMENT AND PLAN / ED COURSE  I reviewed the triage vital signs and the nursing notes.  Patient's presentation is most consistent with acute presentation with potential threat to life or bodily function.  Patient presents emergency department concern for urinary retention.  Patient states only urinating a very small amount yesterday and today since that surgery.  Overall the patient appears well, no distress, appears pleasant.  Vital signs are reassuring.  Bladder scan shows 210 mL in the bladder.  No bladder fullness on the palpation.  However given the burning sensation he reports as well we will check labs including a CBC chemistry and a urinalysis.  We will IV hydrate while awaiting results.  Patient agreeable to plan.  Patient has received fluids was able to urinate however repeat bladder scan still shows 200 or so cc of urine in the bladder.  No significant retention.  Urinalysis shows no concerning findings.  Discussed with the patient to increase oral hydration.  We will place the patient on a short course of Pyridium to help with any dysuria  although no sign of infection seen.  Urine culture has been added.  Will discharge patient home with PCP follow-up.  FINAL CLINICAL IMPRESSION(S) / ED DIAGNOSES   Dysuria  Rx / DC Orders   Pyridium  Note:  This document was prepared using Dragon voice recognition software and may include unintentional dictation errors.   Harvest Dark, MD 02/04/22 1149

## 2022-02-04 NOTE — ED Triage Notes (Signed)
Per patient's report, he was here for a hernia operation on left groin area yesterday and was discharged yesterday. Patient states he is unable to void today, except for a "teaspoon" .

## 2022-02-04 NOTE — ED Notes (Signed)
Pt bladder scan indicated 236 mL in bladder.

## 2022-02-04 NOTE — ED Notes (Signed)
Bladder scan volume 210 mL

## 2022-02-06 ENCOUNTER — Encounter: Payer: Self-pay | Admitting: Surgery

## 2022-02-07 ENCOUNTER — Encounter: Payer: Self-pay | Admitting: Surgery

## 2022-05-24 ENCOUNTER — Other Ambulatory Visit: Payer: Self-pay | Admitting: Orthopedic Surgery

## 2022-06-02 ENCOUNTER — Encounter: Payer: Self-pay | Admitting: Orthopedic Surgery

## 2022-06-02 ENCOUNTER — Other Ambulatory Visit: Payer: Medicare Other

## 2022-06-02 ENCOUNTER — Encounter (HOSPITAL_COMMUNITY): Payer: Self-pay | Admitting: Urgent Care

## 2022-06-02 ENCOUNTER — Inpatient Hospital Stay
Admission: RE | Admit: 2022-06-02 | Discharge: 2022-06-02 | Disposition: A | Discharge status: Home / Self Care | Payer: Medicare Other | Source: Ambulatory Visit

## 2022-06-02 DIAGNOSIS — M1712 Unilateral primary osteoarthritis, left knee: Secondary | ICD-10-CM

## 2022-06-02 DIAGNOSIS — Z01812 Encounter for preprocedural laboratory examination: Secondary | ICD-10-CM

## 2022-06-02 HISTORY — DX: Unilateral primary osteoarthritis, left knee: M17.12

## 2022-06-02 HISTORY — DX: Anxiety disorder, unspecified: F41.9

## 2022-06-02 HISTORY — DX: Type 2 diabetes mellitus without complications: E11.9

## 2022-06-02 NOTE — Pre-Procedure Instructions (Signed)
Called patient to check as to why he has not showed up to pre-admit testing office scheduled for 06/02/2022 and patient stated he was not aware of this appointment and that wanted to re-schedule for Tuesday, Jun 06, 2022. He is currently driving from [place] as we spoke. Patient was instructed to bring medication list and or medication bottles. Patient verbalized understanding.

## 2022-06-02 NOTE — Patient Instructions (Addendum)
Your procedure is scheduled on: 06/15/2022  Report to the Registration Desk on the 1st floor of the [facility]. To find out your arrival time, please call [phone] between 1PM - 3PM on: 6/5/ 2024  If your arrival time is 6:00 am, do not arrive before that time as the [facility] entrance doors do not open until 6:00 am.  REMEMBER: Instructions that are not followed completely may result in serious medical risk, up to and including death; or upon the discretion of your surgeon and anesthesiologist your surgery may need to be rescheduled.  Do not eat food after midnight the night before surgery.  No gum chewing or hard candies.  You may however, drink CLEAR liquids up to 2 hours before you are scheduled to arrive for your surgery. Do not drink anything within 2 hours of your scheduled arrival time.  Clear liquids include: - water    In addition, your doctor has ordered for you to drink the provided:  Gatorade G2 Drinking this carbohydrate drink up to two hours before surgery helps to reduce insulin resistance and improve patient outcomes. Please complete drinking 2 hours before scheduled arrival time.  One week prior to surgery: Stop Anti-inflammatories (NSAIDS) such as Advil, Aleve, Ibuprofen, Motrin, Naproxen, Naprosyn and Aspirin based products such as Excedrin, Goody's Powder, BC Powder. Stop ANY OVER THE COUNTER supplements until after surgery. You may however, continue to take Tylenol if needed for pain up until the day of surgery.  Continue taking all prescribed medications with the exception of the following:   Follow recommendations from Cardiologist or PCP regarding stopping blood thinners.  TAKE ONLY THESE MEDICATIONS THE MORNING OF SURGERY WITH A SIP OF WATER:    Antacid (take one the night before and one on the morning of surgery - helps to prevent nausea after surgery.)  Use inhalers on the day of surgery and bring to the hospital.    No Alcohol for 24  hours before or after surgery.  No Smoking including e-cigarettes for 24 hours before surgery.  No chewable tobacco products for at least 6 hours before surgery.  No nicotine patches on the day of surgery.  Do not use any "recreational" drugs for at least a week (preferably 2 weeks) before your surgery.  Please be advised that the combination of cocaine and anesthesia may have negative outcomes, up to and including death. If you test positive for cocaine, your surgery will be cancelled.  On the morning of surgery brush your teeth with toothpaste and water, you may rinse your mouth with mouthwash if you wish. Do not swallow any toothpaste or mouthwash.  Use CHG Soap or wipes as directed on instruction sheet.  Do not wear jewelry, make-up, hairpins, clips or nail polish.  Do not wear lotions, powders, or perfumes.   Do not shave body hair from the neck down 48 hours before surgery.  Contact lenses, hearing aids and dentures may not be worn into surgery.  Do not bring valuables to the hospital. [facility] is not responsible for any missing/lost belongings or valuables.     Bring your C-PAP to the hospital in case you may have to spend the night.   Notify your doctor if there is any change in your medical condition (cold, fever, infection).  Wear comfortable clothing (specific to your surgery type) to the hospital.  After surgery, you can help prevent lung complications by doing breathing exercises.  Take deep breaths and cough every 1-2 hours. Your doctor  may order a device called an Incentive Spirometer to help you take deep breaths. When coughing or sneezing, hold a pillow firmly against your incision with both hands. This is called "splinting." Doing this helps protect your incision. It also decreases belly discomfort.  If you are being admitted to the hospital overnight, leave your suitcase in the car. After surgery it may be brought to your room.  In case of increased  patient census, it may be necessary for you, the patient, to continue your postoperative care in the Same Day Surgery department.  If you are being discharged the day of surgery, you will not be allowed to drive home. You will need a responsible individual to drive you home and stay with you for 24 hours after surgery.     Please call the Pre-admissions Testing Dept. at [phone] if you have any questions about these instructions.  Surgery Visitation Policy:  Patients having surgery or a procedure may have two visitors.  Children under the age of 22 must have an adult with them who is not the patient.  Inpatient Visitation:    Visiting hours are 7 a.m. to 8 p.m. Up to four visitors are allowed at one time in a patient room. The visitors may rotate out with other people during the day.  One visitor age 5 or older may stay with the patient overnight and must be in the room by 8 p.m.       Pre-operative 5 CHG Bath Instructions   You can play a key role in reducing the risk of infection after surgery. Your skin needs to be as free of germs as possible. You can reduce the number of germs on your skin by washing with CHG (chlorhexidine gluconate) soap before surgery. CHG is an antiseptic soap that kills germs and continues to kill germs even after washing.   DO NOT use if you have an allergy to chlorhexidine/CHG or antibacterial soaps. If your skin becomes reddened or irritated, stop using the CHG and notify one of our RNs at [phone].   Please shower with the CHG soap starting 4 days before surgery using the following schedule:     Please keep in mind the following:  DO NOT shave, including legs and underarms, starting the day of your first shower.   You may shave your face at any point before/day of surgery.  Place clean sheets on your bed the day you start using CHG soap. Use a clean washcloth (not used since being washed) for each shower. DO NOT sleep with pets once  you start using the CHG.   CHG Shower Instructions:  If you choose to wash your hair and private area, wash first with your normal shampoo/soap.  After you use shampoo/soap, rinse your hair and body thoroughly to remove shampoo/soap residue.  Turn the water OFF and apply about 3 tablespoons (45 ml) of CHG soap to a CLEAN washcloth.  Apply CHG soap ONLY FROM YOUR NECK DOWN TO YOUR TOES (washing for 3-5 minutes)  DO NOT use CHG soap on face, private areas, open wounds, or sores.  Pay special attention to the area where your surgery is being performed.  If you are having back surgery, having someone wash your back for you may be helpful. Wait 2 minutes after CHG soap is applied, then you may rinse off the CHG soap.  Pat dry with a clean towel  Put on clean clothes/pajamas   If you choose to wear lotion, please use  ONLY the CHG-compatible lotions on the back of this paper.     Additional instructions for the day of surgery: DO NOT APPLY any lotions, deodorants, cologne, or perfumes.   Put on clean/comfortable clothes.  Brush your teeth.  Ask your nurse before applying any prescription medications to the skin.      CHG Compatible Lotions   Aveeno Moisturizing lotion  Cetaphil Moisturizing Cream  Cetaphil Moisturizing Lotion  Clairol Herbal Essence Moisturizing Lotion, Dry Skin  Clairol Herbal Essence Moisturizing Lotion, Extra Dry Skin  Clairol Herbal Essence Moisturizing Lotion, Normal Skin  Curel Age Defying Therapeutic Moisturizing Lotion with Alpha Hydroxy  Curel Extreme Care Body Lotion  Curel Soothing Hands Moisturizing Hand Lotion  Curel Therapeutic Moisturizing Cream, Fragrance-Free  Curel Therapeutic Moisturizing Lotion, Fragrance-Free  Curel Therapeutic Moisturizing Lotion, Original Formula  Eucerin Daily Replenishing Lotion  Eucerin Dry Skin Therapy Plus Alpha Hydroxy Crme  Eucerin Dry Skin Therapy Plus Alpha Hydroxy Lotion  Eucerin Original Crme  Eucerin Original  Lotion  Eucerin Plus Crme Eucerin Plus Lotion  Eucerin TriLipid Replenishing Lotion  Keri Anti-Bacterial Hand Lotion  Keri Deep Conditioning Original Lotion Dry Skin Formula Softly Scented  Keri Deep Conditioning Original Lotion, Fragrance Free Sensitive Skin Formula  Keri Lotion Fast Absorbing Fragrance Free Sensitive Skin Formula  Keri Lotion Fast Absorbing Softly Scented Dry Skin Formula  Keri Original Lotion  Keri Skin Renewal Lotion Keri Silky Smooth Lotion  Keri Silky Smooth Sensitive Skin Lotion  Nivea Body Creamy Conditioning Oil  Nivea Body Extra Enriched Lotion  Nivea Body Original Lotion  Nivea Body Sheer Moisturizing Lotion Nivea Crme  Nivea Skin Firming Lotion  NutraDerm 30 Skin Lotion  NutraDerm Skin Lotion  NutraDerm Therapeutic Skin Cream  NutraDerm Therapeutic Skin Lotion  ProShield Protective Hand Cream  Provon moisturizing lotion          Preoperative Educational Videos for Total Hip, Knee and Shoulder Replacements  To better prepare for surgery, please view our videos that explain the physical activity and discharge planning required to have the best surgical recovery at [facility].  [email or web address]  Questions? Call [phone] or email [email or web address]             How to Use an Incentive Spirometer  An incentive spirometer is a tool that measures how well you are filling your lungs with each breath. Learning to take long, deep breaths using this tool can help you keep your lungs clear and active. This may help to reverse or lessen your chance of developing breathing (pulmonary) problems, especially infection. You may be asked to use a spirometer: After a surgery. If you have a lung problem or a history of smoking. After a long period of time when you have been unable to move or be active. If the spirometer includes an indicator to show the highest number that you have reached, your  health care provider or respiratory therapist will help you set a goal. Keep a log of your progress as told by your health care provider. What are the risks? Breathing too quickly may cause dizziness or cause you to pass out. Take your time so you do not get dizzy or light-headed. If you are in pain, you may need to take pain medicine before doing incentive spirometry. It is harder to take a deep breath if you are having pain. How to use your incentive spirometer  Sit up on the edge of your bed or on a chair. Hold the incentive spirometer so that it  is in an upright position. Before you use the spirometer, breathe out normally. Place the mouthpiece in your mouth. Make sure your lips are closed tightly around it. Breathe in slowly and as deeply as you can through your mouth, causing the piston or the ball to rise toward the top of the chamber. Hold your breath for 3-5 seconds, or for as long as possible. If the spirometer includes a coach indicator, use this to guide you in breathing. Slow down your breathing if the indicator goes above the marked areas. Remove the mouthpiece from your mouth and breathe out normally. The piston or ball will return to the bottom of the chamber. Rest for a few seconds, then repeat the steps 10 or more times. Take your time and take a few normal breaths between deep breaths so that you do not get dizzy or light-headed. Do this every 1-2 hours when you are awake. If the spirometer includes a goal marker to show the highest number you have reached (best effort), use this as a goal to work toward during each repetition. After each set of 10 deep breaths, cough a few times. This will help to make sure that your lungs are clear. If you have an incision on your chest or abdomen from surgery, place a pillow or a rolled-up towel firmly against the incision when you cough. This can help to reduce pain while taking deep breaths and coughing. General tips When you are able to  get out of bed: Walk around often. Continue to take deep breaths and cough in order to clear your lungs. Keep using the incentive spirometer until your health care provider says it is okay to stop using it. If you have been in the hospital, you may be told to keep using the spirometer at home. Contact a health care provider if: You are having difficulty using the spirometer. You have trouble using the spirometer as often as instructed. Your pain medicine is not giving enough relief for you to use the spirometer as told. You have a fever. Get help right away if: You develop shortness of breath. You develop a cough with bloody mucus from the lungs. You have fluid or blood coming from an incision site after you cough. Summary An incentive spirometer is a tool that can help you learn to take long, deep breaths to keep your lungs clear and active. You may be asked to use a spirometer after a surgery, if you have a lung problem or a history of smoking, or if you have been inactive for a long period of time. Use your incentive spirometer as instructed every 1-2 hours while you are awake. If you have an incision on your chest or abdomen, place a pillow or a rolled-up towel firmly against your incision when you cough. This will help to reduce pain. Get help right away if you have shortness of breath, you cough up bloody mucus, or blood comes from your incision when you cough. This information is not intended to replace advice given to you by your health care provider. Make sure you discuss any questions you have with your health care provider. Document Revised: 03/17/2019 Document Reviewed: 03/17/2019 Elsevier Patient Education  2023 [employer].

## 2022-06-06 ENCOUNTER — Inpatient Hospital Stay
Admission: RE | Admit: 2022-06-06 | Discharge: 2022-06-06 | Disposition: A | Discharge status: Home / Self Care | Payer: Medicare Other | Source: Ambulatory Visit

## 2022-06-06 ENCOUNTER — Other Ambulatory Visit: Payer: Medicare Other

## 2022-06-06 NOTE — Pre-Procedure Instructions (Addendum)
Approximately , 2:15 pm , finally talked to patient after several attempt and he stated that he just need to cancel his procedure/surgery because he have some some family issues he needs to take care of in [place]. Surgeon's office was notified of patient' issues. As of Pre-admit standpoint, he is cancelled and we will wait for an updated patient' schedule's preference.

## 2022-06-06 NOTE — Pre-Procedure Instructions (Signed)
  Patient has not showed up for his in person Pre admit office visit. Called the patient several times with no success. I am unable to leave message due to mailbox has not been set up yet. I reached out to daughter and she is suppose to find out why he has not showed up and call me back today.

## 2022-06-06 NOTE — Pre-Procedure Instructions (Signed)
Surgeon's office was notified of patient not showing up for Pre-admit appointment.

## 2022-06-06 NOTE — Progress Notes (Signed)
Reached out to daughter again, and she stated that she can't reach him as well. We will let office surgeons' office know.

## 2022-06-15 ENCOUNTER — Ambulatory Visit: Admit: 2022-06-15 | Payer: Medicare Other | Admitting: Orthopedic Surgery

## 2022-06-15 HISTORY — DX: Dysphagia, unspecified: R13.10

## 2022-06-15 SURGERY — ARTHROPLASTY, KNEE, TOTAL
Anesthesia: Choice | Site: Knee | Laterality: Left

## 2022-11-22 ENCOUNTER — Other Ambulatory Visit: Payer: Self-pay | Admitting: Orthopedic Surgery

## 2022-12-05 ENCOUNTER — Encounter
Admission: RE | Admit: 2022-12-05 | Discharge: 2022-12-05 | Disposition: A | Discharge status: Home / Self Care | Payer: Medicare Other | Source: Ambulatory Visit | Attending: Orthopedic Surgery | Admitting: Orthopedic Surgery

## 2022-12-05 ENCOUNTER — Inpatient Hospital Stay: Admission: RE | Admit: 2022-12-05 | Payer: Medicare Other | Source: Ambulatory Visit

## 2022-12-05 ENCOUNTER — Other Ambulatory Visit: Payer: Self-pay

## 2022-12-05 VITALS — Ht 71.0 in | Wt 173.0 lb

## 2022-12-05 DIAGNOSIS — E119 Type 2 diabetes mellitus without complications: Secondary | ICD-10-CM

## 2022-12-05 DIAGNOSIS — Z01812 Encounter for preprocedural laboratory examination: Secondary | ICD-10-CM

## 2022-12-05 NOTE — Patient Instructions (Signed)
Your procedure is scheduled on: Thursday 12/14/22 Report to the Registration Desk on the 1st floor of [facility]. To find out your arrival time, please call [phone] between 1PM - 3PM on: Wednesday 12/13/22 If your arrival time is 6:00 am, do not arrive before that time as the [facility] entrance doors do not open until 6:00 am.  REMEMBER: Instructions that are not followed completely may result in serious medical risk, up to and including death; or upon the discretion of your surgeon and anesthesiologist your surgery may need to be rescheduled.  Do not eat food after midnight the night before surgery.  No gum chewing or hard candies.  You may however, drink CLEAR liquids up to 2 hours before you are scheduled to arrive for your surgery. Do not drink anything within 2 hours of your scheduled arrival time.  Clear liquids include: - water  Do NOT drink anything that is not on this list.   In addition, your doctor has ordered for you to drink the provided:  Gatorade G2 Drinking this carbohydrate drink up to two hours before surgery helps to reduce insulin resistance and improve patient outcomes. Please complete drinking 2 hours before scheduled arrival time.  One week prior to surgery: Stop Anti-inflammatories (NSAIDS) such as Advil, Aleve, Ibuprofen, Motrin, Naproxen, Meloxicam, Naprosyn and Aspirin based products such as Excedrin, Goody's Powder, BC Powder. Stop ANY OVER THE COUNTER supplements until after surgery.  You may however, continue to take Tylenol if needed for pain up until the day of surgery.  Continue taking all of your other prescription medications up until the day of surgery.  ON THE DAY OF SURGERY ONLY TAKE THESE MEDICATIONS WITH SIPS OF WATER:  pravastatin (PRAVACHOL) 80 MG    No Alcohol for 24 hours before or after surgery.  No Smoking including e-cigarettes for 24 hours before surgery.  No chewable tobacco products for at least 6 hours before  surgery.  No nicotine patches on the day of surgery.  Do not use any "recreational" drugs for at least a week (preferably 2 weeks) before your surgery.  Please be advised that the combination of cocaine and anesthesia may have negative outcomes, up to and including death. If you test positive for cocaine, your surgery will be cancelled.  On the morning of surgery brush your teeth with toothpaste and water, you may rinse your mouth with mouthwash if you wish. Do not swallow any toothpaste or mouthwash.  Use CHG Soap or wipes as directed on instruction sheet.  Do not wear jewelry, make-up, hairpins, clips or nail polish.  For welded (permanent) jewelry: bracelets, anklets, waist bands, etc.  Please have this removed prior to surgery.  If it is not removed, there is a chance that hospital personnel will need to cut it off on the day of surgery.  Do not wear lotions, powders, or perfumes.   Do not shave body hair from the neck down 48 hours before surgery.  Contact lenses, hearing aids and dentures may not be worn into surgery.  Do not bring valuables to the hospital. [facility] is not responsible for any missing/lost belongings or valuables.    Notify your doctor if there is any change in your medical condition (cold, fever, infection).  Wear comfortable clothing (specific to your surgery type) to the hospital.  After surgery, you can help prevent lung complications by doing breathing exercises.  Take deep breaths and cough every 1-2 hours. Your doctor may order a device called  an Facilities manager to help you take deep breaths. When coughing or sneezing, hold a pillow firmly against your incision with both hands. This is called "splinting." Doing this helps protect your incision. It also decreases belly discomfort.  If you are being admitted to the hospital overnight, leave your suitcase in the car. After surgery it may be brought to your room.  In case of increased patient  census, it may be necessary for you, the patient, to continue your postoperative care in the Same Day Surgery department.  If you are being discharged the day of surgery, you will not be allowed to drive home. You will need a responsible individual to drive you home and stay with you for 24 hours after surgery.   If you are taking public transportation, you will need to have a responsible individual with you.  Please call the Pre-admissions Testing Dept. at [phone] if you have any questions about these instructions.  Surgery Visitation Policy:  Patients having surgery or a procedure may have two visitors.  Children under the age of 72 must have an adult with them who is not the patient.  Inpatient Visitation:    Visiting hours are 7 a.m. to 8 p.m. Up to four visitors are allowed at one time in a patient room. The visitors may rotate out with other people during the day. One visitor age 83 or older may stay with the patient overnight and must be in the room by 8 p.m.    Pre-operative 5 CHG Bath Instructions   You can play a key role in reducing the risk of infection after surgery. Your skin needs to be as free of germs as possible. You can reduce the number of germs on your skin by washing with CHG (chlorhexidine gluconate) soap before surgery. CHG is an antiseptic soap that kills germs and continues to kill germs even after washing.   DO NOT use if you have an allergy to chlorhexidine/CHG or antibacterial soaps. If your skin becomes reddened or irritated, stop using the CHG and notify one of our RNs at [phone].   Please shower with the CHG soap starting 4 days before surgery using the following schedule:     Please keep in mind the following:  DO NOT shave, including legs and underarms, starting the day of your first shower.   You may shave your face at any point before/day of surgery.  Place clean sheets on your bed the day you start using CHG soap. Use a clean  washcloth (not used since being washed) for each shower. DO NOT sleep with pets once you start using the CHG.   CHG Shower Instructions:  If you choose to wash your hair and private area, wash first with your normal shampoo/soap.  After you use shampoo/soap, rinse your hair and body thoroughly to remove shampoo/soap residue.  Turn the water OFF and apply about 3 tablespoons (45 ml) of CHG soap to a CLEAN washcloth.  Apply CHG soap ONLY FROM YOUR NECK DOWN TO YOUR TOES (washing for 3-5 minutes)  DO NOT use CHG soap on face, private areas, open wounds, or sores.  Pay special attention to the area where your surgery is being performed.  If you are having back surgery, having someone wash your back for you may be helpful. Wait 2 minutes after CHG soap is applied, then you may rinse off the CHG soap.  Pat dry with a clean towel  Put on clean clothes/pajamas   If you  choose to wear lotion, please use ONLY the CHG-compatible lotions on the back of this paper.     Additional instructions for the day of surgery: DO NOT APPLY any lotions, deodorants, cologne, or perfumes.   Put on clean/comfortable clothes.  Brush your teeth.  Ask your nurse before applying any prescription medications to the skin.      CHG Compatible Lotions   Aveeno Moisturizing lotion  Cetaphil Moisturizing Cream  Cetaphil Moisturizing Lotion  Clairol Herbal Essence Moisturizing Lotion, Dry Skin  Clairol Herbal Essence Moisturizing Lotion, Extra Dry Skin  Clairol Herbal Essence Moisturizing Lotion, Normal Skin  Curel Age Defying Therapeutic Moisturizing Lotion with Alpha Hydroxy  Curel Extreme Care Body Lotion  Curel Soothing Hands Moisturizing Hand Lotion  Curel Therapeutic Moisturizing Cream, Fragrance-Free  Curel Therapeutic Moisturizing Lotion, Fragrance-Free  Curel Therapeutic Moisturizing Lotion, Original Formula  Eucerin Daily Replenishing Lotion  Eucerin Dry Skin Therapy Plus Alpha Hydroxy Crme  Eucerin  Dry Skin Therapy Plus Alpha Hydroxy Lotion  Eucerin Original Crme  Eucerin Original Lotion  Eucerin Plus Crme Eucerin Plus Lotion  Eucerin TriLipid Replenishing Lotion  Keri Anti-Bacterial Hand Lotion  Keri Deep Conditioning Original Lotion Dry Skin Formula Softly Scented  Keri Deep Conditioning Original Lotion, Fragrance Free Sensitive Skin Formula  Keri Lotion Fast Absorbing Fragrance Free Sensitive Skin Formula  Keri Lotion Fast Absorbing Softly Scented Dry Skin Formula  Keri Original Lotion  Keri Skin Renewal Lotion Keri Silky Smooth Lotion  Keri Silky Smooth Sensitive Skin Lotion  Nivea Body Creamy Conditioning Oil  Nivea Body Extra Enriched Lotion  Nivea Body Original Lotion  Nivea Body Sheer Moisturizing Lotion Nivea Crme  Nivea Skin Firming Lotion  NutraDerm 30 Skin Lotion  NutraDerm Skin Lotion  NutraDerm Therapeutic Skin Cream  NutraDerm Therapeutic Skin Lotion  ProShield Protective Hand Cream  Provon moisturizing lotion  How to Use an Incentive Spirometer  An incentive spirometer is a tool that measures how well you are filling your lungs with each breath. Learning to take long, deep breaths using this tool can help you keep your lungs clear and active. This may help to reverse or lessen your chance of developing breathing (pulmonary) problems, especially infection. You may be asked to use a spirometer: After a surgery. If you have a lung problem or a history of smoking. After a long period of time when you have been unable to move or be active. If the spirometer includes an indicator to show the highest number that you have reached, your health care provider or respiratory therapist will help you set a goal. Keep a log of your progress as told by your health care provider. What are the risks? Breathing too quickly may cause dizziness or cause you to pass out. Take your time so you do not get dizzy or light-headed. If you are in pain, you may need to take pain medicine  before doing incentive spirometry. It is harder to take a deep breath if you are having pain. How to use your incentive spirometer  Sit up on the edge of your bed or on a chair. Hold the incentive spirometer so that it is in an upright position. Before you use the spirometer, breathe out normally. Place the mouthpiece in your mouth. Make sure your lips are closed tightly around it. Breathe in slowly and as deeply as you can through your mouth, causing the piston or the ball to rise toward the top of the chamber. Hold your breath for 3-5 seconds, or for as  long as possible. If the spirometer includes a coach indicator, use this to guide you in breathing. Slow down your breathing if the indicator goes above the marked areas. Remove the mouthpiece from your mouth and breathe out normally. The piston or ball will return to the bottom of the chamber. Rest for a few seconds, then repeat the steps 10 or more times. Take your time and take a few normal breaths between deep breaths so that you do not get dizzy or light-headed. Do this every 1-2 hours when you are awake. If the spirometer includes a goal marker to show the highest number you have reached (best effort), use this as a goal to work toward during each repetition. After each set of 10 deep breaths, cough a few times. This will help to make sure that your lungs are clear. If you have an incision on your chest or abdomen from surgery, place a pillow or a rolled-up towel firmly against the incision when you cough. This can help to reduce pain while taking deep breaths and coughing. General tips When you are able to get out of bed: Walk around often. Continue to take deep breaths and cough in order to clear your lungs. Keep using the incentive spirometer until your health care provider says it is okay to stop using it. If you have been in the hospital, you may be told to keep using the spirometer at home. Contact a health care provider if: You  are having difficulty using the spirometer. You have trouble using the spirometer as often as instructed. Your pain medicine is not giving enough relief for you to use the spirometer as told. You have a fever. Get help right away if: You develop shortness of breath. You develop a cough with bloody mucus from the lungs. You have fluid or blood coming from an incision site after you cough. Summary An incentive spirometer is a tool that can help you learn to take long, deep breaths to keep your lungs clear and active. You may be asked to use a spirometer after a surgery, if you have a lung problem or a history of smoking, or if you have been inactive for a long period of time. Use your incentive spirometer as instructed every 1-2 hours while you are awake. If you have an incision on your chest or abdomen, place a pillow or a rolled-up towel firmly against your incision when you cough. This will help to reduce pain. Get help right away if you have shortness of breath, you cough up bloody mucus, or blood comes from your incision when you cough. This information is not intended to replace advice given to you by your health care provider. Make sure you discuss any questions you have with your health care provider. Document Revised: 03/17/2019 Document Reviewed: 03/17/2019 Elsevier Patient Education  2023 [employer].   Please go to the following website to access important education materials concerning your upcoming joint replacement.                                                          [email or web address]

## 2022-12-06 ENCOUNTER — Inpatient Hospital Stay: Admission: RE | Admit: 2022-12-06 | Payer: Medicare Other | Source: Ambulatory Visit

## 2022-12-11 ENCOUNTER — Encounter
Admission: RE | Admit: 2022-12-11 | Discharge: 2022-12-11 | Disposition: A | Discharge status: Home / Self Care | Payer: Medicare Other | Source: Ambulatory Visit | Attending: Orthopedic Surgery | Admitting: Orthopedic Surgery

## 2022-12-11 ENCOUNTER — Encounter: Payer: Self-pay | Admitting: Urgent Care

## 2022-12-11 DIAGNOSIS — Z79899 Other long term (current) drug therapy: Secondary | ICD-10-CM | POA: Insufficient documentation

## 2022-12-11 DIAGNOSIS — E785 Hyperlipidemia, unspecified: Secondary | ICD-10-CM | POA: Diagnosis not present

## 2022-12-11 DIAGNOSIS — M1712 Unilateral primary osteoarthritis, left knee: Secondary | ICD-10-CM | POA: Diagnosis not present

## 2022-12-11 DIAGNOSIS — I10 Essential (primary) hypertension: Secondary | ICD-10-CM | POA: Diagnosis not present

## 2022-12-11 DIAGNOSIS — Z01812 Encounter for preprocedural laboratory examination: Secondary | ICD-10-CM

## 2022-12-11 DIAGNOSIS — Z01810 Encounter for preprocedural cardiovascular examination: Secondary | ICD-10-CM | POA: Diagnosis present

## 2022-12-11 DIAGNOSIS — E119 Type 2 diabetes mellitus without complications: Secondary | ICD-10-CM | POA: Insufficient documentation

## 2022-12-11 DIAGNOSIS — Z01818 Encounter for other preprocedural examination: Secondary | ICD-10-CM | POA: Insufficient documentation

## 2022-12-11 LAB — COMPREHENSIVE METABOLIC PANEL
ALT: 11 U/L (ref 0–44)
AST: 16 U/L (ref 15–41)
Albumin: 4.2 g/dL (ref 3.5–5.0)
Alkaline Phosphatase: 66 U/L (ref 38–126)
Anion gap: 9 (ref 5–15)
BUN: 11 mg/dL (ref 8–23)
CO2: 27 mmol/L (ref 22–32)
Calcium: 9 mg/dL (ref 8.9–10.3)
Chloride: 100 mmol/L (ref 98–111)
Creatinine, Ser: 0.97 mg/dL (ref 0.61–1.24)
GFR, Estimated: >60 mL/min (ref >60)
Glucose, Bld: 110 mg/dL — ABNORMAL HIGH (ref 70–99)
Potassium: 3.5 mmol/L (ref 3.5–5.1)
Sodium: 136 mmol/L (ref 135–145)
Total Bilirubin: 1.2 mg/dL — ABNORMAL HIGH (ref <1.2)
Total Protein: 7.2 g/dL (ref 6.5–8.1)

## 2022-12-11 LAB — URINALYSIS, ROUTINE W REFLEX MICROSCOPIC
Bilirubin Urine: NEGATIVE
Glucose, UA: NEGATIVE mg/dL
Hgb urine dipstick: NEGATIVE
Ketones, ur: 5 mg/dL — AB
Leukocytes,Ua: NEGATIVE
Nitrite: NEGATIVE
Protein, ur: NEGATIVE mg/dL
Specific Gravity, Urine: 1.018 (ref 1.005–1.030)
pH: 5 (ref 5.0–8.0)

## 2022-12-11 LAB — SURGICAL PCR SCREEN
MRSA, PCR: NEGATIVE
Staphylococcus aureus: NEGATIVE

## 2022-12-11 LAB — CBC
HCT: 48.7 % (ref 39.0–52.0)
Hemoglobin: 16.7 g/dL (ref 13.0–17.0)
MCH: 31.7 pg (ref 26.0–34.0)
MCHC: 34.3 g/dL (ref 30.0–36.0)
MCV: 92.4 fL (ref 80.0–100.0)
Platelets: 230 10*3/uL (ref 150–400)
RBC: 5.27 MIL/uL (ref 4.22–5.81)
RDW: 13 % (ref 11.5–15.5)
WBC: 7.9 10*3/uL (ref 4.0–10.5)
nRBC: 0 % (ref 0.0–0.2)

## 2022-12-13 MED ORDER — CEFAZOLIN SODIUM-DEXTROSE 2-4 GM/100ML-% IV SOLN: 2.0000 g | INTRAVENOUS | Status: DC

## 2022-12-13 MED ORDER — DEXAMETHASONE SODIUM PHOSPHATE 10 MG/ML IJ SOLN: 8.0000 mg | Freq: Once | INTRAMUSCULAR | Status: DC

## 2022-12-13 MED ORDER — SODIUM CHLORIDE 0.9 % IV SOLN: INTRAVENOUS | Status: DC

## 2022-12-13 MED ORDER — CHLORHEXIDINE GLUCONATE 0.12 % MT SOLN: 15.0000 mL | Freq: Once | OROMUCOSAL | Status: DC

## 2022-12-13 MED ORDER — TRANEXAMIC ACID-NACL 1000-0.7 MG/100ML-% IV SOLN: 1000.0000 mg | INTRAVENOUS | Status: DC

## 2022-12-13 MED ORDER — ORAL CARE MOUTH RINSE: 15.0000 mL | Freq: Once | OROMUCOSAL | Status: DC

## 2022-12-14 ENCOUNTER — Other Ambulatory Visit: Payer: Self-pay

## 2022-12-14 ENCOUNTER — Encounter: Payer: Self-pay | Admitting: Orthopedic Surgery

## 2022-12-14 ENCOUNTER — Encounter
Admission: RE | Disposition: A | Discharge status: Home / Self Care | Payer: Self-pay | Source: Home / Self Care | Attending: Orthopedic Surgery

## 2022-12-14 ENCOUNTER — Ambulatory Visit
Admission: RE | Admit: 2022-12-14 | Discharge: 2022-12-14 | Disposition: A | Discharge status: Home / Self Care | Payer: Medicare Other | Attending: Orthopedic Surgery | Admitting: Orthopedic Surgery

## 2022-12-14 DIAGNOSIS — E785 Hyperlipidemia, unspecified: Secondary | ICD-10-CM

## 2022-12-14 DIAGNOSIS — M172 Bilateral post-traumatic osteoarthritis of knee: Secondary | ICD-10-CM | POA: Diagnosis present

## 2022-12-14 DIAGNOSIS — Z538 Procedure and treatment not carried out for other reasons: Secondary | ICD-10-CM | POA: Diagnosis not present

## 2022-12-14 DIAGNOSIS — E119 Type 2 diabetes mellitus without complications: Secondary | ICD-10-CM

## 2022-12-14 DIAGNOSIS — M1712 Unilateral primary osteoarthritis, left knee: Secondary | ICD-10-CM

## 2022-12-14 DIAGNOSIS — Z01812 Encounter for preprocedural laboratory examination: Secondary | ICD-10-CM

## 2022-12-14 DIAGNOSIS — Z79899 Other long term (current) drug therapy: Secondary | ICD-10-CM

## 2022-12-14 DIAGNOSIS — I10 Essential (primary) hypertension: Secondary | ICD-10-CM

## 2022-12-14 LAB — GLUCOSE, CAPILLARY: Glucose-Capillary: 95 mg/dL (ref 70–99)

## 2022-12-14 SURGERY — ARTHROPLASTY, KNEE, TOTAL
Anesthesia: Choice | Site: Knee | Laterality: Left

## 2022-12-14 MED ORDER — TRANEXAMIC ACID-NACL 1000-0.7 MG/100ML-% IV SOLN
INTRAVENOUS | Status: AC
  Filled 2022-12-14: qty 100

## 2022-12-14 MED ORDER — PROPOFOL 1000 MG/100ML IV EMUL
INTRAVENOUS | Status: AC
  Filled 2022-12-14: qty 100

## 2022-12-14 MED ORDER — FENTANYL CITRATE (PF) 100 MCG/2ML IJ SOLN
INTRAMUSCULAR | Status: AC
  Filled 2022-12-14: qty 2

## 2022-12-14 MED ORDER — CEFAZOLIN SODIUM-DEXTROSE 2-4 GM/100ML-% IV SOLN
INTRAVENOUS | Status: AC
Start: 2022-12-14 — End: ?
  Filled 2022-12-14: qty 100

## 2022-12-14 MED ORDER — PROPOFOL 10 MG/ML IV BOLUS
INTRAVENOUS | Status: AC
  Filled 2022-12-14: qty 20

## 2022-12-14 MED ORDER — MIDAZOLAM HCL 2 MG/2ML IJ SOLN
INTRAMUSCULAR | Status: AC
  Filled 2022-12-14: qty 2

## 2022-12-14 MED ORDER — CHLORHEXIDINE GLUCONATE 0.12 % MT SOLN
OROMUCOSAL | Status: AC
  Filled 2022-12-14: qty 15

## 2022-12-14 SURGICAL SUPPLY — 55 items
BLADE SAGITTAL AGGR TOOTH XLG (BLADE) IMPLANT
BLADE SAW SAG 25X90X1.19 (BLADE) ×1 IMPLANT
BLADE SAW SAG 29X58X.64 (BLADE) ×1 IMPLANT
BNDG ELASTIC 6INX 5YD STR LF (GAUZE/BANDAGES/DRESSINGS) ×1 IMPLANT
BOWL CEMENT MIX W/ADAPTER (MISCELLANEOUS) ×1 IMPLANT
CEMENT BONE R 1X40 (Cement) ×2 IMPLANT
CHLORAPREP W/TINT 26 (MISCELLANEOUS) ×2 IMPLANT
COOLER POLAR GLACIER W/PUMP (MISCELLANEOUS) ×1 IMPLANT
CUFF TRNQT CYL 24X4X16.5-23 (TOURNIQUET CUFF) IMPLANT
CUFF TRNQT CYL 30X4X21-28X (TOURNIQUET CUFF) IMPLANT
DERMABOND ADVANCED .7 DNX12 (GAUZE/BANDAGES/DRESSINGS) ×1 IMPLANT
DRAPE 3/4 80X56 (DRAPES) ×1 IMPLANT
DRAPE INCISE IOBAN 66X60 STRL (DRAPES) IMPLANT
DRSG MEPILEX SACRM 8.7X9.8 (GAUZE/BANDAGES/DRESSINGS) ×1 IMPLANT
DRSG OPSITE POSTOP 4X10 (GAUZE/BANDAGES/DRESSINGS) IMPLANT
DRSG OPSITE POSTOP 4X8 (GAUZE/BANDAGES/DRESSINGS) IMPLANT
ELECT REM PT RETURN 9FT ADLT (ELECTROSURGICAL) ×1
ELECTRODE REM PT RTRN 9FT ADLT (ELECTROSURGICAL) ×1 IMPLANT
GLOVE BIO SURGEON STRL SZ8 (GLOVE) ×1 IMPLANT
GLOVE BIOGEL PI IND STRL 8 (GLOVE) ×1 IMPLANT
GLOVE PI ORTHO PRO STRL 7.5 (GLOVE) ×2 IMPLANT
GLOVE PI ORTHO PRO STRL SZ8 (GLOVE) ×2 IMPLANT
GLOVE SURG SYN 7.5 E (GLOVE) ×1 IMPLANT
GOWN SRG XL LVL 3 NONREINFORCE (GOWNS) ×1 IMPLANT
GOWN STRL REUS W/ TWL LRG LVL3 (GOWN DISPOSABLE) ×1 IMPLANT
GOWN STRL REUS W/ TWL XL LVL3 (GOWN DISPOSABLE) ×1 IMPLANT
HOOD PEEL AWAY T7 (MISCELLANEOUS) ×2 IMPLANT
IV NS IRRIG 3000ML ARTHROMATIC (IV SOLUTION) ×1 IMPLANT
KIT TURNOVER KIT A (KITS) ×1 IMPLANT
MANIFOLD NEPTUNE II (INSTRUMENTS) ×1 IMPLANT
MARKER SKIN DUAL TIP RULER LAB (MISCELLANEOUS) ×1 IMPLANT
MAT ABSORB FLUID 56X50 GRAY (MISCELLANEOUS) ×1 IMPLANT
NEEDLE HYPO 21X1.5 SAFETY (NEEDLE) ×1 IMPLANT
PACK TOTAL KNEE (MISCELLANEOUS) ×1 IMPLANT
PAD ARMBOARD 7.5X6 YLW CONV (MISCELLANEOUS) ×3 IMPLANT
PAD WRAPON POLAR KNEE (MISCELLANEOUS) ×1 IMPLANT
PENCIL SMOKE EVACUATOR (MISCELLANEOUS) ×1 IMPLANT
PULSAVAC PLUS IRRIG FAN TIP (DISPOSABLE) ×1
SLEEVE SCD COMPRESS KNEE MED (STOCKING) ×1 IMPLANT
SOLUTION IRRIG SURGIPHOR (IV SOLUTION) ×1 IMPLANT
SUT STRATA 1 CT-1 DLB (SUTURE) ×1
SUT STRATAFIX 14 PDO 48 VLT (SUTURE) ×1 IMPLANT
SUT STRATAFIX PDO 1 14 VIOLET (SUTURE) ×1
SUT VIC AB 0 CT1 36 (SUTURE) ×1 IMPLANT
SUT VIC AB 2-0 CT2 27 (SUTURE) ×2 IMPLANT
SUT VICRYL 1-0 27IN ABS (SUTURE) ×1
SUTURE STRATA SPIR 4-0 18 (SUTURE) ×1 IMPLANT
SUTURE VICRYL 1-0 27IN ABS (SUTURE) ×1 IMPLANT
SYR 30ML LL (SYRINGE) ×2 IMPLANT
TAPE CLOTH 3X10 WHT NS LF (GAUZE/BANDAGES/DRESSINGS) ×1 IMPLANT
TIP FAN IRRIG PULSAVAC PLUS (DISPOSABLE) ×1 IMPLANT
TOWEL OR 17X26 4PK STRL BLUE (TOWEL DISPOSABLE) IMPLANT
TRAP FLUID SMOKE EVACUATOR (MISCELLANEOUS) ×1 IMPLANT
WATER STERILE IRR 1000ML POUR (IV SOLUTION) ×1 IMPLANT
WRAPON POLAR PAD KNEE (MISCELLANEOUS) ×1

## 2022-12-14 NOTE — OR Nursing (Signed)
This patient stated that he had coffee with creamer this am. His procedure got rescheduled for 13:00 pm for this afternoon. The patient decided to cancel the surgery, he stated that he was going home and he was going to think if he wants to reschedule the surgery. MD Aberman and Anesthesiologist were notified.

## 2022-12-15 ENCOUNTER — Other Ambulatory Visit: Payer: Self-pay | Admitting: Orthopedic Surgery

## 2023-01-08 ENCOUNTER — Other Ambulatory Visit: Payer: Self-pay | Admitting: Orthopedic Surgery

## 2023-01-17 ENCOUNTER — Encounter
Admission: RE | Admit: 2023-01-17 | Discharge: 2023-01-17 | Disposition: A | Discharge status: Home / Self Care | Payer: Medicare Other | Source: Ambulatory Visit | Attending: Orthopedic Surgery | Admitting: Orthopedic Surgery

## 2023-01-17 VITALS — Ht 71.0 in | Wt 173.1 lb

## 2023-01-17 DIAGNOSIS — Z01818 Encounter for other preprocedural examination: Secondary | ICD-10-CM

## 2023-01-17 HISTORY — DX: Pneumonia, unspecified organism: J18.9

## 2023-01-17 HISTORY — DX: Male erectile dysfunction, unspecified: N52.9

## 2023-01-17 HISTORY — DX: Personal history of nicotine dependence: Z87.891

## 2023-01-17 HISTORY — DX: Neoplasm of unspecified behavior of digestive system: D49.0

## 2023-01-17 HISTORY — DX: Diverticulosis of large intestine without perforation or abscess without bleeding: K57.30

## 2023-01-17 NOTE — Patient Instructions (Addendum)
 Your procedure is scheduled on:01-22-23 Monday Report to the Registration Desk on the 1st floor of the [facility].Then proceed to the 2nd floor Surgery Desk To find out your arrival time, please call [phone] between 1PM - 3PM on:01-19-23 Friday If your arrival time is 6:00 am, do not arrive before that time as the [facility] entrance doors do not open until 6:00 am.  REMEMBER: Instructions that are not followed completely may result in serious medical risk, up to and including death; or upon the discretion of your surgeon and anesthesiologist your surgery may need to be rescheduled.  Do not eat food after midnight the night before surgery.  No gum chewing or hard candies.  You may however, drink Water  up to 2 hours before you are scheduled to arrive for your surgery. Do not drink anything within 2 hours of your scheduled arrival time.  In addition, your doctor has ordered for you to drink the provided:  Gatorade G2 Drinking this carbohydrate drink up to two hours before surgery helps to reduce insulin resistance and improve patient outcomes. Please complete drinking 2 hours before scheduled arrival time.  One week prior to surgery:Stop NOW (01-17-23)  Stop Anti-inflammatories (NSAIDS) such as Advil, Aleve, Ibuprofen, Motrin, Naproxen, Naprosyn and Aspirin  based products such as Excedrin, Goody's Powder, BC Powder. Stop ANY OVER THE COUNTER supplements until after surgery.  You may however, continue to take Tylenol  if needed for pain up until the day of surgery  Continue taking all of your other prescription medications up until the day of surgery.  ON THE DAY OF SURGERY ONLY TAKE THESE MEDICATIONS WITH SIPS OF WATER : -citalopram  (CELEXA )  -pravastatin  (PRAVACHOL )   No Alcohol for 24 hours before or after surgery.  No Smoking including e-cigarettes for 24 hours before surgery.  No chewable tobacco products for at least 6 hours before surgery.  No nicotine patches on the day of  surgery.  Do not use any recreational drugs for at least a week (preferably 2 weeks) before your surgery.  Please be advised that the combination of cocaine and anesthesia may have negative outcomes, up to and including death. If you test positive for cocaine, your surgery will be cancelled.  On the morning of surgery brush your teeth with toothpaste and water , you may rinse your mouth with mouthwash if you wish. Do not swallow any toothpaste or mouthwash.  Use CHG Soap as directed on instruction sheet.  Do not wear jewelry, make-up, hairpins, clips or nail polish.  For welded (permanent) jewelry: bracelets, anklets, waist bands, etc.  Please have this removed prior to surgery.  If it is not removed, there is a chance that hospital personnel will need to cut it off on the day of surgery.  Do not wear lotions, powders, or perfumes.   Do not shave body hair from the neck down 48 hours before surgery.  Contact lenses, hearing aids and dentures may not be worn into surgery.  Do not bring valuables to the hospital. [facility] is not responsible for any missing/lost belongings or valuables.   Notify your doctor if there is any change in your medical condition (cold, fever, infection).  Wear comfortable clothing (specific to your surgery type) to the hospital.  After surgery, you can help prevent lung complications by doing breathing exercises.  Take deep breaths and cough every 1-2 hours. Your doctor may order a device called an Incentive Spirometer to help you take deep breaths. When coughing or sneezing, hold a pillow  firmly against your incision with both hands. This is called "splinting." Doing this helps protect your incision. It also decreases belly discomfort.  If you are being admitted to the hospital overnight, leave your suitcase in the car. After surgery it may be brought to your room.  In case of increased patient census, it may be necessary for you, the patient, to continue  your postoperative care in the Same Day Surgery department.  If you are being discharged the day of surgery, you will not be allowed to drive home. You will need a responsible individual to drive you home and stay with you for 24 hours after surgery.   If you are taking public transportation, you will need to have a responsible individual with you.  Please call the Pre-admissions Testing Dept. at [phone] if you have any questions about these instructions.  Surgery Visitation Policy:  Patients having surgery or a procedure may have two visitors.  Children under the age of 6 must have an adult with them who is not the patient.  Inpatient Visitation:    Visiting hours are 7 a.m. to 8 p.m. Up to four visitors are allowed at one time in a patient room. The visitors may rotate out with other people during the day.  One visitor age 36 or older may stay with the patient overnight and must be in the room by 8 p.m.    Pre-operative 5 CHG Bath Instructions   You can play a key role in reducing the risk of infection after surgery. Your skin needs to be as free of germs as possible. You can reduce the number of germs on your skin by washing with CHG (chlorhexidine  gluconate) soap before surgery. CHG is an antiseptic soap that kills germs and continues to kill germs even after washing.   DO NOT use if you have an allergy to chlorhexidine /CHG or antibacterial soaps. If your skin becomes reddened or irritated, stop using the CHG and notify one of our RNs at [phone].   Please shower with the CHG soap starting 4 days before surgery using the following schedule:     Please keep in mind the following:  DO NOT shave, including legs and underarms, starting the day of your first shower.   You may shave your face at any point before/day of surgery.  Place clean sheets on your bed the day you start using CHG soap. Use a clean washcloth (not used since being washed) for each shower. DO NOT  sleep with pets once you start using the CHG.   CHG Shower Instructions:  If you choose to wash your hair and private area, wash first with your normal shampoo/soap.  After you use shampoo/soap, rinse your hair and body thoroughly to remove shampoo/soap residue.  Turn the water  OFF and apply about 3 tablespoons (45 ml) of CHG soap to a CLEAN washcloth.  Apply CHG soap ONLY FROM YOUR NECK DOWN TO YOUR TOES (washing for 3-5 minutes)  DO NOT use CHG soap on face, private areas, open wounds, or sores.  Pay special attention to the area where your surgery is being performed.  If you are having back surgery, having someone wash your back for you may be helpful. Wait 2 minutes after CHG soap is applied, then you may rinse off the CHG soap.  Pat dry with a clean towel  Put on clean clothes/pajamas   If you choose to wear lotion, please use ONLY the CHG-compatible lotions on the back of this  paper.     Additional instructions for the day of surgery: DO NOT APPLY any lotions, deodorants, cologne, or perfumes.   Put on clean/comfortable clothes.  Brush your teeth.  Ask your nurse before applying any prescription medications to the skin.      CHG Compatible Lotions   Aveeno Moisturizing lotion  Cetaphil Moisturizing Cream  Cetaphil Moisturizing Lotion  Clairol Herbal Essence Moisturizing Lotion, Dry Skin  Clairol Herbal Essence Moisturizing Lotion, Extra Dry Skin  Clairol Herbal Essence Moisturizing Lotion, Normal Skin  Curel Age Defying Therapeutic Moisturizing Lotion with Alpha Hydroxy  Curel Extreme Care Body Lotion  Curel Soothing Hands Moisturizing Hand Lotion  Curel Therapeutic Moisturizing Cream, Fragrance-Free  Curel Therapeutic Moisturizing Lotion, Fragrance-Free  Curel Therapeutic Moisturizing Lotion, Original Formula  Eucerin Daily Replenishing Lotion  Eucerin Dry Skin Therapy Plus Alpha Hydroxy Crme  Eucerin Dry Skin Therapy Plus Alpha Hydroxy Lotion  Eucerin Original  Crme  Eucerin Original Lotion  Eucerin Plus Crme Eucerin Plus Lotion  Eucerin TriLipid Replenishing Lotion  Keri Anti-Bacterial Hand Lotion  Keri Deep Conditioning Original Lotion Dry Skin Formula Softly Scented  Keri Deep Conditioning Original Lotion, Fragrance Free Sensitive Skin Formula  Keri Lotion Fast Absorbing Fragrance Free Sensitive Skin Formula  Keri Lotion Fast Absorbing Softly Scented Dry Skin Formula  Keri Original Lotion  Keri Skin Renewal Lotion Keri Silky Smooth Lotion  Keri Silky Smooth Sensitive Skin Lotion  Nivea Body Creamy Conditioning Oil  Nivea Body Extra Enriched Lotion  Nivea Body Original Lotion  Nivea Body Sheer Moisturizing Lotion Nivea Crme  Nivea Skin Firming Lotion  NutraDerm 30 Skin Lotion  NutraDerm Skin Lotion  NutraDerm Therapeutic Skin Cream  NutraDerm Therapeutic Skin Lotion  ProShield Protective Hand Cream  How to Use an Incentive Spirometer An incentive spirometer is a tool that measures how well you are filling your lungs with each breath. Learning to take long, deep breaths using this tool can help you keep your lungs clear and active. This may help to reverse or lessen your chance of developing breathing (pulmonary) problems, especially infection. You may be asked to use a spirometer: After a surgery. If you have a lung problem or a history of smoking. After a long period of time when you have been unable to move or be active. If the spirometer includes an indicator to show the highest number that you have reached, your health care provider or respiratory therapist will help you set a goal. Keep a log of your progress as told by your health care provider. What are the risks? Breathing too quickly may cause dizziness or cause you to pass out. Take your time so you do not get dizzy or light-headed. If you are in pain, you may need to take pain medicine before doing incentive spirometry. It is harder to take a deep breath if you are having  pain. How to use your incentive spirometer  Sit up on the edge of your bed or on a chair. Hold the incentive spirometer so that it is in an upright position. Before you use the spirometer, breathe out normally. Place the mouthpiece in your mouth. Make sure your lips are closed tightly around it. Breathe in slowly and as deeply as you can through your mouth, causing the piston or the ball to rise toward the top of the chamber. Hold your breath for 3-5 seconds, or for as long as possible. If the spirometer includes a coach indicator, use this to guide you in breathing. Slow down your  breathing if the indicator goes above the marked areas. Remove the mouthpiece from your mouth and breathe out normally. The piston or ball will return to the bottom of the chamber. Rest for a few seconds, then repeat the steps 10 or more times. Take your time and take a few normal breaths between deep breaths so that you do not get dizzy or light-headed. Do this every 1-2 hours when you are awake. If the spirometer includes a goal marker to show the highest number you have reached (best effort), use this as a goal to work toward during each repetition. After each set of 10 deep breaths, cough a few times. This will help to make sure that your lungs are clear. If you have an incision on your chest or abdomen from surgery, place a pillow or a rolled-up towel firmly against the incision when you cough. This can help to reduce pain while taking deep breaths and coughing. General tips When you are able to get out of bed: Walk around often. Continue to take deep breaths and cough in order to clear your lungs. Keep using the incentive spirometer until your health care provider says it is okay to stop using it. If you have been in the hospital, you may be told to keep using the spirometer at home. Contact a health care provider if: You are having difficulty using the spirometer. You have trouble using the spirometer as  often as instructed. Your pain medicine is not giving enough relief for you to use the spirometer as told. You have a fever. Get help right away if: You develop shortness of breath. You develop a cough with bloody mucus from the lungs. You have fluid or blood coming from an incision site after you cough. Summary An incentive spirometer is a tool that can help you learn to take long, deep breaths to keep your lungs clear and active. You may be asked to use a spirometer after a surgery, if you have a lung problem or a history of smoking, or if you have been inactive for a long period of time. Use your incentive spirometer as instructed every 1-2 hours while you are awake. If you have an incision on your chest or abdomen, place a pillow or a rolled-up towel firmly against your incision when you cough. This will help to reduce pain. Get help right away if you have shortness of breath, you cough up bloody mucus, or blood comes from your incision when you cough. This information is not intended to replace advice given to you by your health care provider. Make sure you discuss any questions you have with your health care provider. Document Revised: 03/17/2019 Document Reviewed: 03/17/2019 Elsevier Patient Education  2024 [employer].

## 2023-01-22 ENCOUNTER — Ambulatory Visit: Payer: Medicare Other

## 2023-01-22 ENCOUNTER — Ambulatory Visit: Payer: Medicare Other | Admitting: Urgent Care

## 2023-01-22 ENCOUNTER — Other Ambulatory Visit: Payer: Self-pay

## 2023-01-22 ENCOUNTER — Ambulatory Visit: Payer: Medicare Other | Admitting: Anesthesiology

## 2023-01-22 ENCOUNTER — Encounter: Payer: Self-pay | Admitting: Orthopedic Surgery

## 2023-01-22 ENCOUNTER — Encounter
Admission: RE | Disposition: A | Discharge status: Home Health | Payer: Self-pay | Source: Home / Self Care | Attending: Orthopedic Surgery

## 2023-01-22 ENCOUNTER — Observation Stay
Admission: RE | Admit: 2023-01-22 | Discharge: 2023-01-23 | Disposition: A | Discharge status: Home Health | Payer: Medicare Other | Attending: Orthopedic Surgery | Admitting: Orthopedic Surgery

## 2023-01-22 DIAGNOSIS — M1712 Unilateral primary osteoarthritis, left knee: Secondary | ICD-10-CM | POA: Diagnosis present

## 2023-01-22 DIAGNOSIS — Z96652 Presence of left artificial knee joint: Principal | ICD-10-CM

## 2023-01-22 DIAGNOSIS — I10 Essential (primary) hypertension: Secondary | ICD-10-CM | POA: Diagnosis not present

## 2023-01-22 DIAGNOSIS — E119 Type 2 diabetes mellitus without complications: Secondary | ICD-10-CM | POA: Insufficient documentation

## 2023-01-22 DIAGNOSIS — J449 Chronic obstructive pulmonary disease, unspecified: Secondary | ICD-10-CM | POA: Diagnosis not present

## 2023-01-22 DIAGNOSIS — Z79899 Other long term (current) drug therapy: Secondary | ICD-10-CM | POA: Insufficient documentation

## 2023-01-22 DIAGNOSIS — Z01818 Encounter for other preprocedural examination: Secondary | ICD-10-CM

## 2023-01-22 HISTORY — PX: TOTAL KNEE ARTHROPLASTY: SHX125

## 2023-01-22 LAB — GLUCOSE, CAPILLARY: Glucose-Capillary: 120 mg/dL — ABNORMAL HIGH (ref 70–99)

## 2023-01-22 SURGERY — ARTHROPLASTY, KNEE, TOTAL
Anesthesia: Spinal | Site: Knee | Laterality: Left

## 2023-01-22 MED ORDER — DEXAMETHASONE SODIUM PHOSPHATE 10 MG/ML IJ SOLN
INTRAMUSCULAR | Status: AC
  Filled 2023-01-22: qty 1

## 2023-01-22 MED ORDER — DOCUSATE SODIUM 100 MG PO CAPS
ORAL_CAPSULE | ORAL | Status: AC
  Filled 2023-01-22: qty 1

## 2023-01-22 MED ORDER — DEXAMETHASONE SODIUM PHOSPHATE 10 MG/ML IJ SOLN
8.0000 mg | Freq: Once | INTRAMUSCULAR | Status: AC
  Administered 2023-01-22: 8 mg via INTRAVENOUS

## 2023-01-22 MED ORDER — ONDANSETRON HCL 4 MG/2ML IJ SOLN
INTRAMUSCULAR | Status: DC | PRN
  Administered 2023-01-22: 4 mg via INTRAVENOUS

## 2023-01-22 MED ORDER — TRANEXAMIC ACID-NACL 1000-0.7 MG/100ML-% IV SOLN
1000.0000 mg | INTRAVENOUS | Status: AC
  Administered 2023-01-22 (×2): 1000 mg via INTRAVENOUS

## 2023-01-22 MED ORDER — TRAMADOL HCL 50 MG PO TABS: 50.0000 mg | ORAL_TABLET | Freq: Four times a day (QID) | ORAL | Status: DC | PRN

## 2023-01-22 MED ORDER — SODIUM CHLORIDE (PF) 0.9 % IJ SOLN
INTRAMUSCULAR | Status: DC | PRN
  Administered 2023-01-22: 71 mL

## 2023-01-22 MED ORDER — ACETAMINOPHEN 325 MG PO TABS: 325.0000 mg | ORAL_TABLET | Freq: Four times a day (QID) | ORAL | Status: DC | PRN

## 2023-01-22 MED ORDER — PROPOFOL 500 MG/50ML IV EMUL
INTRAVENOUS | Status: DC | PRN
  Administered 2023-01-22: 75 ug/kg/min via INTRAVENOUS

## 2023-01-22 MED ORDER — FENTANYL CITRATE (PF) 100 MCG/2ML IJ SOLN
25.0000 ug | INTRAMUSCULAR | Status: DC | PRN
  Administered 2023-01-22 (×2): 50 ug via INTRAVENOUS

## 2023-01-22 MED ORDER — KETAMINE HCL 50 MG/5ML IJ SOSY
PREFILLED_SYRINGE | INTRAMUSCULAR | Status: AC
  Filled 2023-01-22: qty 5

## 2023-01-22 MED ORDER — CEFAZOLIN SODIUM-DEXTROSE 2-4 GM/100ML-% IV SOLN
2.0000 g | INTRAVENOUS | Status: AC
  Administered 2023-01-22: 2 g via INTRAVENOUS

## 2023-01-22 MED ORDER — FENTANYL CITRATE (PF) 100 MCG/2ML IJ SOLN
INTRAMUSCULAR | Status: AC
  Filled 2023-01-22: qty 2

## 2023-01-22 MED ORDER — CHLORHEXIDINE GLUCONATE 0.12 % MT SOLN
OROMUCOSAL | Status: AC
  Filled 2023-01-22: qty 15

## 2023-01-22 MED ORDER — ACETAMINOPHEN 10 MG/ML IV SOLN
INTRAVENOUS | Status: DC | PRN
  Administered 2023-01-22: 1000 mg via INTRAVENOUS

## 2023-01-22 MED ORDER — TRANEXAMIC ACID-NACL 1000-0.7 MG/100ML-% IV SOLN
INTRAVENOUS | Status: AC
  Filled 2023-01-22: qty 100

## 2023-01-22 MED ORDER — HYDROCODONE-ACETAMINOPHEN 5-325 MG PO TABS
1.0000 | ORAL_TABLET | ORAL | Status: DC | PRN
  Administered 2023-01-23: 2 via ORAL

## 2023-01-22 MED ORDER — ORAL CARE MOUTH RINSE: 15.0000 mL | Freq: Once | OROMUCOSAL | Status: DC

## 2023-01-22 MED ORDER — MENTHOL 3 MG MT LOZG
1.0000 | LOZENGE | OROMUCOSAL | Status: DC | PRN
  Filled 2023-01-22: qty 9

## 2023-01-22 MED ORDER — ENOXAPARIN SODIUM 30 MG/0.3ML IJ SOSY
30.0000 mg | PREFILLED_SYRINGE | Freq: Two times a day (BID) | INTRAMUSCULAR | Status: DC
  Administered 2023-01-23: 30 mg via SUBCUTANEOUS

## 2023-01-22 MED ORDER — ACETAMINOPHEN 500 MG PO TABS
ORAL_TABLET | ORAL | Status: AC
  Filled 2023-01-22: qty 2

## 2023-01-22 MED ORDER — CEFAZOLIN SODIUM-DEXTROSE 2-4 GM/100ML-% IV SOLN
INTRAVENOUS | Status: AC
Start: 2023-01-22 — End: ?
  Filled 2023-01-22: qty 100

## 2023-01-22 MED ORDER — CEFAZOLIN SODIUM-DEXTROSE 2-4 GM/100ML-% IV SOLN
INTRAVENOUS | Status: AC
  Filled 2023-01-22: qty 100

## 2023-01-22 MED ORDER — ACETAMINOPHEN 500 MG PO TABS
1000.0000 mg | ORAL_TABLET | Freq: Three times a day (TID) | ORAL | Status: DC
  Administered 2023-01-22 – 2023-01-23 (×2): 1000 mg via ORAL

## 2023-01-22 MED ORDER — TRANEXAMIC ACID-NACL 1000-0.7 MG/100ML-% IV SOLN
INTRAVENOUS | Status: AC
Start: 2023-01-22 — End: ?
  Filled 2023-01-22: qty 100

## 2023-01-22 MED ORDER — SODIUM CHLORIDE FLUSH 0.9 % IV SOLN
INTRAVENOUS | Status: AC
  Filled 2023-01-22: qty 40

## 2023-01-22 MED ORDER — CITALOPRAM HYDROBROMIDE 20 MG PO TABS
20.0000 mg | ORAL_TABLET | ORAL | Status: DC
  Administered 2023-01-22 – 2023-01-23 (×2): 20 mg via ORAL
  Filled 2023-01-22 (×2): qty 1

## 2023-01-22 MED ORDER — SODIUM CHLORIDE 0.9 % IV SOLN
INTRAVENOUS | Status: DC
Start: 2023-01-22 — End: 2023-01-22

## 2023-01-22 MED ORDER — MIDAZOLAM HCL 2 MG/2ML IJ SOLN
INTRAMUSCULAR | Status: AC
Start: 2023-01-22 — End: ?
  Filled 2023-01-22: qty 2

## 2023-01-22 MED ORDER — BUPIVACAINE HCL (PF) 0.5 % IJ SOLN
INTRAMUSCULAR | Status: AC
  Filled 2023-01-22: qty 10

## 2023-01-22 MED ORDER — SODIUM CHLORIDE 0.9 % IV SOLN: INTRAVENOUS | Status: DC

## 2023-01-22 MED ORDER — KETOROLAC TROMETHAMINE 15 MG/ML IJ SOLN
7.5000 mg | Freq: Four times a day (QID) | INTRAMUSCULAR | Status: AC
  Administered 2023-01-22 – 2023-01-23 (×4): 7.5 mg via INTRAVENOUS

## 2023-01-22 MED ORDER — PANTOPRAZOLE SODIUM 40 MG PO TBEC
DELAYED_RELEASE_TABLET | ORAL | Status: AC
  Filled 2023-01-22: qty 1

## 2023-01-22 MED ORDER — KETOROLAC TROMETHAMINE 15 MG/ML IJ SOLN
INTRAMUSCULAR | Status: AC
  Filled 2023-01-22: qty 1

## 2023-01-22 MED ORDER — STERILE WATER FOR IRRIGATION IR SOLN
Status: DC | PRN
  Administered 2023-01-22: 1000 mL

## 2023-01-22 MED ORDER — BUPIVACAINE HCL (PF) 0.5 % IJ SOLN
INTRAMUSCULAR | Status: DC | PRN
  Administered 2023-01-22: 2.5 mL

## 2023-01-22 MED ORDER — PANTOPRAZOLE SODIUM 40 MG PO TBEC
40.0000 mg | DELAYED_RELEASE_TABLET | Freq: Every day | ORAL | Status: DC
  Administered 2023-01-22 – 2023-01-23 (×2): 40 mg via ORAL

## 2023-01-22 MED ORDER — CEFAZOLIN SODIUM-DEXTROSE 2-4 GM/100ML-% IV SOLN
2.0000 g | Freq: Four times a day (QID) | INTRAVENOUS | Status: AC
  Administered 2023-01-22 (×2): 2 g via INTRAVENOUS

## 2023-01-22 MED ORDER — KETOROLAC TROMETHAMINE 30 MG/ML IJ SOLN
INTRAMUSCULAR | Status: AC
  Filled 2023-01-22: qty 1

## 2023-01-22 MED ORDER — ACETAMINOPHEN 10 MG/ML IV SOLN
INTRAVENOUS | Status: AC
  Filled 2023-01-22: qty 100

## 2023-01-22 MED ORDER — KETAMINE HCL 50 MG/5ML IJ SOSY
PREFILLED_SYRINGE | INTRAMUSCULAR | Status: DC | PRN
  Administered 2023-01-22 (×3): 10 mg via INTRAVENOUS

## 2023-01-22 MED ORDER — PROPOFOL 1000 MG/100ML IV EMUL
INTRAVENOUS | Status: AC
Start: 2023-01-22 — End: ?
  Filled 2023-01-22: qty 100

## 2023-01-22 MED ORDER — ONDANSETRON HCL 4 MG/2ML IJ SOLN: 4.0000 mg | Freq: Four times a day (QID) | INTRAMUSCULAR | Status: DC | PRN

## 2023-01-22 MED ORDER — BUPIVACAINE LIPOSOME 1.3 % IJ SUSP
INTRAMUSCULAR | Status: AC
  Filled 2023-01-22: qty 20

## 2023-01-22 MED ORDER — CHLORHEXIDINE GLUCONATE 0.12 % MT SOLN
15.0000 mL | Freq: Once | OROMUCOSAL | Status: DC
  Administered 2023-01-22: 15 mL via OROMUCOSAL

## 2023-01-22 MED ORDER — METOCLOPRAMIDE HCL 5 MG/ML IJ SOLN
5.0000 mg | Freq: Three times a day (TID) | INTRAMUSCULAR | Status: DC | PRN
Start: 2023-01-22 — End: 2023-01-23

## 2023-01-22 MED ORDER — OXYCODONE HCL 5 MG PO TABS
ORAL_TABLET | ORAL | Status: AC
  Filled 2023-01-22: qty 1

## 2023-01-22 MED ORDER — SURGIPHOR WOUND IRRIGATION SYSTEM - OPTIME
TOPICAL | Status: DC | PRN
  Administered 2023-01-22: 450 mL via TOPICAL

## 2023-01-22 MED ORDER — PRAVASTATIN SODIUM 40 MG PO TABS
ORAL_TABLET | ORAL | Status: AC
  Filled 2023-01-22: qty 1

## 2023-01-22 MED ORDER — METOCLOPRAMIDE HCL 10 MG PO TABS: 5.0000 mg | ORAL_TABLET | Freq: Three times a day (TID) | ORAL | Status: DC | PRN

## 2023-01-22 MED ORDER — ONDANSETRON HCL 4 MG PO TABS: 4.0000 mg | ORAL_TABLET | Freq: Four times a day (QID) | ORAL | Status: DC | PRN

## 2023-01-22 MED ORDER — BUPIVACAINE-EPINEPHRINE (PF) 0.25% -1:200000 IJ SOLN
INTRAMUSCULAR | Status: AC
  Filled 2023-01-22: qty 30

## 2023-01-22 MED ORDER — SODIUM CHLORIDE 0.9 % IR SOLN
Status: DC | PRN
  Administered 2023-01-22: 3000 mL

## 2023-01-22 MED ORDER — FENTANYL CITRATE (PF) 100 MCG/2ML IJ SOLN
INTRAMUSCULAR | Status: DC | PRN
  Administered 2023-01-22: 50 ug via INTRAVENOUS

## 2023-01-22 MED ORDER — KETOROLAC TROMETHAMINE 15 MG/ML IJ SOLN
INTRAMUSCULAR | Status: AC
Start: 2023-01-22 — End: ?
  Filled 2023-01-22: qty 1

## 2023-01-22 MED ORDER — MORPHINE SULFATE (PF) 2 MG/ML IV SOLN: 0.5000 mg | INTRAVENOUS | Status: DC | PRN

## 2023-01-22 MED ORDER — PRAVASTATIN SODIUM 20 MG PO TABS
80.0000 mg | ORAL_TABLET | ORAL | Status: DC
  Administered 2023-01-22 – 2023-01-23 (×2): 80 mg via ORAL

## 2023-01-22 MED ORDER — OXYCODONE HCL 5 MG PO TABS
5.0000 mg | ORAL_TABLET | Freq: Once | ORAL | Status: AC
  Administered 2023-01-22: 5 mg via ORAL

## 2023-01-22 MED ORDER — MIDAZOLAM HCL 5 MG/5ML IJ SOLN
INTRAMUSCULAR | Status: DC | PRN
  Administered 2023-01-22 (×2): 1 mg via INTRAVENOUS

## 2023-01-22 MED ORDER — DOCUSATE SODIUM 100 MG PO CAPS
100.0000 mg | ORAL_CAPSULE | Freq: Two times a day (BID) | ORAL | Status: DC
  Administered 2023-01-22 – 2023-01-23 (×3): 100 mg via ORAL

## 2023-01-22 MED ORDER — PHENOL 1.4 % MT LIQD
1.0000 | OROMUCOSAL | Status: DC | PRN
  Filled 2023-01-22: qty 177

## 2023-01-22 MED ORDER — ONDANSETRON HCL 4 MG/2ML IJ SOLN
INTRAMUSCULAR | Status: AC
  Filled 2023-01-22: qty 2

## 2023-01-22 SURGICAL SUPPLY — 68 items
BLADE PATELLA W-PILOT HOLE 35 (BLADE) IMPLANT
BLADE SAGITTAL AGGR TOOTH XLG (BLADE) IMPLANT
BLADE SAW SAG 25X90X1.19 (BLADE) ×1 IMPLANT
BLADE SAW SAG 29X58X.64 (BLADE) ×1 IMPLANT
BNDG ELASTIC 6INX 5YD STR LF (GAUZE/BANDAGES/DRESSINGS) ×1 IMPLANT
BOWL CEMENT MIX W/ADAPTER (MISCELLANEOUS) ×1 IMPLANT
BRUSH SCRUB EZ PLAIN DRY (MISCELLANEOUS) ×1 IMPLANT
CEMENT BONE R 1X40 (Cement) ×2 IMPLANT
CHLORAPREP W/TINT 26 (MISCELLANEOUS) ×2 IMPLANT
COOLER POLAR GLACIER W/PUMP (MISCELLANEOUS) ×1 IMPLANT
CUFF TRNQT CYL 24X4X16.5-23 (TOURNIQUET CUFF) IMPLANT
CUFF TRNQT CYL 30X4X21-28X (TOURNIQUET CUFF) IMPLANT
DERMABOND ADVANCED .7 DNX12 (GAUZE/BANDAGES/DRESSINGS) ×1 IMPLANT
DRAPE SHEET LG 3/4 BI-LAMINATE (DRAPES) ×1 IMPLANT
DRSG MEPILEX SACRM 8.7X9.8 (GAUZE/BANDAGES/DRESSINGS) ×1 IMPLANT
DRSG OPSITE POSTOP 4X10 (GAUZE/BANDAGES/DRESSINGS) IMPLANT
DRSG OPSITE POSTOP 4X8 (GAUZE/BANDAGES/DRESSINGS) IMPLANT
ELECT REM PT RETURN 9FT ADLT (ELECTROSURGICAL) ×1
ELECTRODE REM PT RTRN 9FT ADLT (ELECTROSURGICAL) ×1 IMPLANT
GLOVE BIO SURGEON STRL SZ8 (GLOVE) ×1 IMPLANT
GLOVE BIOGEL PI IND STRL 8 (GLOVE) ×1 IMPLANT
GLOVE PI ORTHO PRO STRL 7.5 (GLOVE) ×2 IMPLANT
GLOVE PI ORTHO PRO STRL SZ8 (GLOVE) ×2 IMPLANT
GLOVE SURG SYN 7.5 E (GLOVE) ×1
GLOVE SURG SYN 7.5 PF PI (GLOVE) ×1 IMPLANT
GOWN SRG XL LVL 3 NONREINFORCE (GOWNS) ×1 IMPLANT
GOWN STRL REUS W/ TWL LRG LVL3 (GOWN DISPOSABLE) ×1 IMPLANT
GOWN STRL REUS W/ TWL XL LVL3 (GOWN DISPOSABLE) ×1 IMPLANT
HOOD PEEL AWAY T7 (MISCELLANEOUS) ×2 IMPLANT
INSERT ARTISURF S8-11 18X22X14 (Insert) IMPLANT
IV NS IRRIG 3000ML ARTHROMATIC (IV SOLUTION) ×1 IMPLANT
KIT TURNOVER KIT A (KITS) ×1 IMPLANT
MANIFOLD NEPTUNE II (INSTRUMENTS) ×1 IMPLANT
MARKER SKIN DUAL TIP RULER LAB (MISCELLANEOUS) ×1 IMPLANT
MAT ABSORB FLUID 56X50 GRAY (MISCELLANEOUS) ×1 IMPLANT
NDL HYPO 21X1.5 SAFETY (NEEDLE) ×1 IMPLANT
NEEDLE HYPO 21X1.5 SAFETY (NEEDLE) ×1
PACK TOTAL KNEE (MISCELLANEOUS) ×1 IMPLANT
PAD ARMBOARD 7.5X6 YLW CONV (MISCELLANEOUS) ×3 IMPLANT
PAD WRAPON POLAR KNEE (MISCELLANEOUS) ×1 IMPLANT
PENCIL SMOKE EVACUATOR (MISCELLANEOUS) ×1 IMPLANT
PIN DRILL HDLS TROCAR 75 4PK (PIN) IMPLANT
PSN FEM CR CMT CCR STD SZ10 L (Joint) ×1 IMPLANT
PULSAVAC PLUS IRRIG FAN TIP (DISPOSABLE) ×1
SCREW FEMALE HEX FIX 25X2.5 (ORTHOPEDIC DISPOSABLE SUPPLIES) IMPLANT
SCREW HEX HEADED 3.5X27 DISP (ORTHOPEDIC DISPOSABLE SUPPLIES) IMPLANT
SLEEVE SCD COMPRESS KNEE MED (STOCKING) ×1 IMPLANT
SOLUTION IRRIG SURGIPHOR (IV SOLUTION) ×1 IMPLANT
STEM POLY PAT PLY 35M KNEE (Knees) IMPLANT
STEM TIB ST PERS 14+30 (Stem) IMPLANT
STEM TIBIA 5 DEG SZ G L KNEE (Knees) IMPLANT
SURFACE ARTC PRSNA CCR SZ10 L (Joint) IMPLANT
SUT STRATA 1 CT-1 DLB (SUTURE) ×1
SUT STRATAFIX 14 PDO 48 VLT (SUTURE) ×1 IMPLANT
SUT STRATAFIX PDO 1 14 VIOLET (SUTURE) ×1
SUT VIC AB 0 CT1 36 (SUTURE) ×1 IMPLANT
SUT VIC AB 2-0 CT2 27 (SUTURE) ×2 IMPLANT
SUT VICRYL 1-0 27IN ABS (SUTURE) ×1
SUTURE STRATA SPIR 4-0 18 (SUTURE) ×1 IMPLANT
SUTURE VICRYL 1-0 27IN ABS (SUTURE) ×1 IMPLANT
SYR 30ML LL (SYRINGE) ×2 IMPLANT
TAPE CLOTH 3X10 WHT NS LF (GAUZE/BANDAGES/DRESSINGS) ×1 IMPLANT
TIBIA STEM 5 DEG SZ G L KNEE (Knees) ×1 IMPLANT
TIP FAN IRRIG PULSAVAC PLUS (DISPOSABLE) ×1 IMPLANT
TOWEL OR 17X26 4PK STRL BLUE (TOWEL DISPOSABLE) IMPLANT
TRAP FLUID SMOKE EVACUATOR (MISCELLANEOUS) ×1 IMPLANT
WATER STERILE IRR 1000ML POUR (IV SOLUTION) ×1 IMPLANT
WRAPON POLAR PAD KNEE (MISCELLANEOUS) ×1

## 2023-01-22 NOTE — Transfer of Care (Signed)
 Immediate Anesthesia Transfer of Care Note  Patient: Terry Sheppard  Procedure(s) Performed: TOTAL KNEE ARTHROPLASTY (Left: Knee)  Patient Location: PACU  Anesthesia Type:Spinal  Level of Consciousness: awake and patient cooperative  Airway & Oxygen Therapy: Patient Spontanous Breathing  Post-op Assessment: Report given to RN and Post -op Vital signs reviewed and stable  Post vital signs: Reviewed and stable  Last Vitals:  Vitals Value Taken Time  BP 141/69 01/22/23 0948  Temp 36.7 C 01/22/23 0948  Pulse 51 01/22/23 1001  Resp 18 01/22/23 1001  SpO2 100 % 01/22/23 1001  Vitals shown include unfiled device data.  Last Pain:  Vitals:   01/22/23 0948  TempSrc:   PainSc: 8       Patients Stated Pain Goal: 0 (01/22/23 9371)  Complications: No notable events documented.

## 2023-01-22 NOTE — Discharge Instructions (Addendum)
 Information for Discharge Teaching:  DO NOT REMOVE TEAL EXPAREL  Bracelet for 4 days, 96 hours, 01/26/2023 EXPAREL  (bupivacaine  liposome injectable suspension)   Pain relief is important to your recovery. The goal is to control your pain so you can move easier and return to your normal activities as soon as possible after your procedure. Your physician may use several types of medicines to manage pain, swelling, and more.  Your surgeon or anesthesiologist gave you EXPAREL (bupivacaine ) to help control your pain after surgery.  EXPAREL  is a local anesthetic designed to release slowly over an extended period of time to provide pain relief by numbing the tissue around the surgical site. EXPAREL  is designed to release pain medication over time and can control pain for up to 72 hours. Depending on how you respond to EXPAREL , you may require less pain medication during your recovery. EXPAREL  can help reduce or eliminate the need for opioids during the first few days after surgery when pain relief is needed the most. EXPAREL  is not an opioid and is not addictive. It does not cause sleepiness or sedation.   Important! A teal colored band has been placed on your arm with the date, time and amount of EXPAREL  you have received. Please leave this armband in place for the full 96 hours following administration, and then you may remove the band. If you return to the hospital for any reason within 96 hours following the administration of EXPAREL , the armband provides important information that your health care providers to know, and alerts them that you have received this anesthetic.    Possible side effects of EXPAREL : Temporary loss of sensation or ability to move in the area where medication was injected. Nausea, vomiting, constipation Rarely, numbness and tingling in your mouth or lips, lightheadedness, or anxiety may occur. Call your doctor right away if you think you may be experiencing any of these  sensations, or if you have other questions regarding possible side effects.  Follow all other discharge instructions given to you by your surgeon or nurse. Eat a healthy diet and drink plenty of water  or other fluids.       [facility] They will call you to set up when they are coming out to see you   [address], [place], [place] [address] Hours:  Open ? Closes 5?PM Phone: [phone]         Instructions after Total Knee Replacement   Arthea Sheer M.D.     Dept. of Orthopaedics & Sports Medicine  [facility]  [address]  [place], [place]  [address]  Phone: [phone]   Fax: [phone]    DIET: Drink plenty of non-alcoholic fluids. Resume your normal diet. Include foods high in fiber.  ACTIVITY:  You may use crutches or a walker with weight-bearing as tolerated, unless instructed otherwise. You may be weaned off of the walker or crutches by your Physical Therapist.  Do NOT place pillows under the knee. Anything placed under the knee could limit your ability to straighten the knee.   Continue doing gentle exercises. Exercising will reduce the pain and swelling, increase motion, and prevent muscle weakness.   Please continue to use the TED compression stockings for 2 weeks. You may remove the stockings at night, but should reapply them in the morning. Do not drive or operate any equipment until instructed.  WOUND CARE:  Continue to use the PolarCare or ice packs periodically to reduce pain and swelling. You may begin showering 3 days after  surgery with honeycomb dressing. Remove honeycomb dressing 7 days after surgery and continue showering. Allow dermabond to fall off on its own.  MEDICATIONS: You may resume your regular medications. Please take the pain medication as prescribed on the medication. Do not take pain medication on an empty stomach. You have been given a prescription for a blood thinner (Lovenox  or Coumadin). Please take the  medication as instructed. (NOTE: After completing a 2 week course of Lovenox , take one 81 mg Enteric-coated aspirin  twice a day for 3 additional weeks. This along with elevation will help reduce the possibility of phlebitis in your operated leg.) Do not drive or drink alcoholic beverages when taking pain medications.  POSTOPERATIVE CONSTIPATION PROTOCOL Constipation - defined medically as fewer than three stools per week and severe constipation as less than one stool per week.  One of the most common issues patients have following surgery is constipation.  Even if you have a regular bowel pattern at home, your normal regimen is likely to be disrupted due to multiple reasons following surgery.  Combination of anesthesia, postoperative narcotics, change in appetite and fluid intake all can affect your bowels.  In order to avoid complications following surgery, here are some recommendations in order to help you during your recovery period.  Colace (docusate) - Pick up an over-the-counter form of Colace or another stool softener and take twice a day as long as you are requiring postoperative pain medications.  Take with a full glass of water  daily.  If you experience loose stools or diarrhea, hold the colace until you stool forms back up.  If your symptoms do not get better within 1 week or if they get worse, check with your doctor.  Dulcolax (bisacodyl) - Pick up over-the-counter and take as directed by the product packaging as needed to assist with the movement of your bowels.  Take with a full glass of water .  Use this product as needed if not relieved by Colace only.   MiraLax (polyethylene glycol) - Pick up over-the-counter to have on hand.  MiraLax is a solution that will increase the amount of water  in your bowels to assist with bowel movements.  Take as directed and can mix with a glass of water , juice, soda, coffee, or tea.  Take if you go more than two days without a movement. Do not use MiraLax  more than once per day. Call your doctor if you are still constipated or irregular after using this medication for 7 days in a row.  If you continue to have problems with postoperative constipation, please contact the office for further assistance and recommendations.  If you experience the worst abdominal pain ever or develop nausea or vomiting, please contact the office immediatly for further recommendations for treatment.   CALL THE OFFICE FOR: Temperature above 101 degrees Excessive bleeding or drainage on the dressing. Excessive swelling, coldness, or paleness of the toes. Persistent nausea and vomiting.  FOLLOW-UP:  You should have an appointment to return to the office in 14 days after surgery. Arrangements have been made for continuation of Physical Therapy (either home therapy or outpatient therapy).

## 2023-01-22 NOTE — H&P (Signed)
 History of Present Illness: The patient is an 78 y.o. male seen in clinic today for follow-up evaluation of his left knee prior to planned left total knee replacement. The patient has had ongoing severe dysfunction and issues from his left knee with pain over his anterior and medial left knee. Reports the pain gets up to a 7 out of 10 on a daily basis and affects his activities of daily living and prevents him from participating in activities. He has grinding and catching sensations but has not had frank locking episodes. He has tried knee sleeves cortisone injections and anti-inflammatories without improvement in his knee function. The patient denies fevers, chills, numbness, tingling, shortness of breath, chest pain, recent illness, or any trauma.  Patient is a non-smoker nondiabetic with an A1c of 6 and a BMI of 25.6  Past Medical History: Past Medical History:  Diagnosis Date  Anxiety  Arthritis  COPD (chronic obstructive pulmonary disease) (CMS/HHS-HCC)  Diabetes mellitus without complication (CMS/HHS-HCC)  Former tobacco use  GERD (gastroesophageal reflux disease)  HLD (hyperlipidemia)  Hypertension  PNA (pneumonia)  Right wrist fracture   Past Surgical History: Past Surgical History:  Procedure Laterality Date  LAPAROSCOPIC INGUINAL HERNIA REPAIR Right 06/2019  Robotic by sakai  COLONOSCOPY 03/17/2020  Diverticulosis/Otherwise normal colon/Repeat 61yrs/S[facility]  INGUINAL HERNIA REPAIR Right 02/18/2021  Dr Henriette Pierre -- OPEN for recurrent  INGUINAL HERNIA REPAIR Left 02/03/2022  Dr. Henriette Pierre --- Robotic with mesh  TONSILLECTOMY   Past Family History: Family History  Problem Relation Age of Onset  Breast cancer Mother 62  Skin cancer Father  Dementia Sister  Fibrocystic breast disease Daughter  Pancreatic cancer Maternal Grandfather   Medications: Current Outpatient Medications  Medication Sig Dispense Refill  citalopram  (CELEXA ) 20 MG tablet Take 1 tablet (20 mg  total) by mouth once daily 90 tablet 2  losartan -hydroCHLOROthiazide  (HYZAAR) 100-25 mg tablet Take 1 tablet by mouth once daily 90 tablet 3  pravastatin  (PRAVACHOL ) 80 MG tablet Take 1 tablet (80 mg total) by mouth at bedtime 90 tablet 3   No current facility-administered medications for this visit.   Allergies: Allergies  Allergen Reactions  Lisinopril Cough    Visit Vitals: There were no vitals filed for this visit.   Review of Systems:  A comprehensive 14 point ROS was performed, reviewed, and the pertinent orthopaedic findings are documented in the HPI.  Physical Exam: General/Constitutional: No apparent distress: well-nourished and well developed. Eyes: Pupils equal, round with synchronous movement. Pulmonary exam: Lungs clear to auscultation bilaterally no wheezing rales or rhonchi Cardiac exam: Regular rate and rhythm no obvious murmurs rubs or gallops. Integumentary: No impressive skin lesions present, except as noted in detailed exam. Neuro/Psych: Normal mood and affect, oriented to person, place and time.  Comprehensive Knee Exam: Gait Antalgic on the left  Alignment Neutral   Inspection Right Left  Skin Normal appearance with no obvious deformity. No ecchymosis or erythema. Normal appearance small healing scrape over the lateral knee no sign of infection. No ecchymosis or erythema.  Soft Tissue No focal soft tissue swelling No focal soft tissue swelling  Quad Atrophy None None   Palpation  Right Left  Tenderness No peripatellar, patellar tendon, quad tendon, medial/lateral joint line pain Medial joint line and parapatellar tenderness to palpation  Crepitus No patellofemoral or tibiofemoral crepitus + patellofemoral crepitus  Effusion None None   Range of Motion Right Left  Flexion 0-120 7-110  Extension Full knee extension without hyperextension Full knee extension without hyperextension  Ligamentous Exam Right Left  Lachman Normal Normal  Valgus 0  Normal Normal  Valgus 30 Normal Normal  Varus 0 Normal Normal  Varus 30 Normal Normal  Anterior Drawer Normal Normal  Posterior Drawer Normal Normal   Meniscal Exam Right Left  Hyperflexion Test Negative Positive  Hyperextension Test Negative Positive  McMurray's Negative Positive   Neurovascular Right Left  Quadriceps Strength 5/5 5/5  Hamstring Strength 5/5 5/5  Hip Abductor Strength 4/5 4/5  Distal Motor Normal Normal  Distal Sensory Normal light touch sensation Normal light touch sensation  Distal Pulses Normal Normal     Imaging Studies: I have reviewed AP, lateral,sunrise, and flexed PA weight bearing knee X-rays (4 views) of the left knee taken at the previous office visit show moderate/severe degenerative changes with patellofemoral joint space narrowing with osteophyte formation, subchondral cysts and sclerosis. Kellgren-Lawrence grade 3/4. AP, sunrise, and flexed PA of the right knee also show mild degenerative changes with medial joint space narrowing proximal tibial sclerosis and some mild patellofemoral degenerative changes as well. No fractures or dislocations noted in either knee.   Assessment:  Left knee osteoarthritis  Plan: Kito is a 78 year old male who presents with left knee bone on bone arthritis. Based upon the patient's continued symptoms and failure to respond to conservative treatment, I have recommended a left total knee replacement for this patient. A long discussion took place with the patient describing what a total joint replacement is and what the procedure would entail. A knee model, similar to the implants that will be used during the operation, was utilized to demonstrate the implants. Choices of implant manufactures were discussed and reviewed. The ability to secure the implant utilizing cement or cementless (press fit) fixation was discussed. The approach and exposure was discussed.   The hospitalization and post-operative care and  rehabilitation were also discussed. The use of perioperative antibiotics and DVT prophylaxis were discussed. The risk, benefits and alternatives to a surgical intervention were discussed at length with the patient. The patient was also advised of risks related to the medical comorbidities and elevated body mass index (BMI). A lengthy discussion took place to review the most common complications including but not limited to: stiffness, loss of function, complex regional pain syndrome, deep vein thrombosis, pulmonary embolus, heart attack, stroke, infection, wound breakdown, numbness, intraoperative fracture, damage to nerves, tendon,muscles, arteries or other blood vessels, death and other possible complications from anesthesia. The patient was told that we will take steps to minimize these risks by using sterile technique, antibiotics and DVT prophylaxis when appropriate and follow the patient postoperatively in the office setting to monitor progress. The possibility of recurrent pain, no improvement in pain and actual worsening of pain were also discussed with the patient.   The discharge plan of care focused on the patient going home following surgery. The patient was encouraged to make the necessary arrangements to have someone stay with them when they are discharged home.   The benefits of surgery were discussed with the patient including the potential for improving the patient's current clinical condition through operative intervention. Alternatives to surgical intervention including continued conservative management were also discussed in detail. All questions were answered to the satisfaction of the patient. The patient participated and agreed to the plan of care as well as the use of the recommended implants for their total knee replacement surgery. An information packet was given to the patient to review prior to surgery.   Patient received medical clearance for surgery. All  questions answered patient  agrees above plan I preparations for a left total knee replacement.    Portions of this record have been created using Scientist, clinical (histocompatibility and immunogenetics). Dictation errors have been sought, but may not have been identified and corrected.  Arthea Sheer MD

## 2023-01-22 NOTE — Anesthesia Procedure Notes (Addendum)
 Spinal  Patient location during procedure: OR Start time: 01/22/2023 7:37 AM End time: 01/22/2023 7:42 AM Reason for block: surgical anesthesia Staffing Performed: resident/CRNA  Anesthesiologist: Dario Barter, MD Resident/CRNA: Anice Melnick, CRNA Performed by: Anice Melnick, CRNA Authorized by: Dario Barter, MD   Preanesthetic Checklist Completed: patient identified, IV checked, site marked, risks and benefits discussed, surgical consent, monitors and equipment checked, pre-op evaluation and timeout performed Spinal Block Patient position: sitting Prep: DuraPrep Patient monitoring: heart rate, cardiac monitor, continuous pulse ox and blood pressure Approach: midline Location: L3-4 Injection technique: single-shot Needle Needle type: Sprotte  Needle gauge: 24 G Needle length: 9 cm Assessment Sensory level: T4 Events: CSF return

## 2023-01-22 NOTE — Op Note (Signed)
 Patient Name: Terry Sheppard  FMW:[id number]  Pre-Operative Diagnosis: Left knee Osteoarthritis  Post-Operative Diagnosis: (same)  Procedure: Left Total Knee Arthroplasty  Components/Implants: Femur: Persona Size 10 CR   Tibia: Persona Size G w/ 14x78mm stem extension  Poly: 10mm MC  Patella: 35x74mm symmetric  Femoral Valgus Cut Angle: 5 degrees  Distal Femoral Re-cut: none  Patella Resurfacing: yes   Date of Surgery: 01/22/2023  Surgeon: Arthea Sheer MD  Assistant: Debby Amber PA (present and scrubbed throughout the case, critical for assistance with exposure, retraction, instrumentation, and closure)   Anesthesiologist: Dario  Anesthesia: Spinal  Tourniquet Time: 58 min  EBL: 50cc  IVF: 600cc  Complications: None   Brief history: The patient is a 78 year old male with a history of osteoarthritis of the left knee with pain limiting their range of motion and activities of daily living, which has failed multiple attempts at conservative therapy.  The risks and benefits of total knee arthroplasty as definitive surgical treatment were discussed with the patient, who opted to proceed with the operation.  After outpatient medical clearance and optimization was completed the patient was admitted to [facility] for the procedure.  All preoperative films were reviewed and an appropriate surgical plan was made prior to surgery. Preoperative range of motion was 7 to 110 with a 7 flexion contracture. The patient was identified as having a varus alignment.   Description of procedure: The patient was brought to the operating room where laterality was confirmed by all those present to be the left side.   Spinal anesthesia was administered and the patient received an intravenous dose of antibiotics for surgical prophylaxis and a dose of tranexamic acid .  Patient is positioned supine on the operating room table with all bony prominences well-padded.  A  well-padded tourniquet was applied to the left thigh.  The knee was then prepped and draped in usual sterile fashion with multiple layers of adhesive and nonadhesive drapes.  All of those present in the operating room participated in a surgical timeout laterality and patient were confirmed.   An Esmarch was wrapped around the extremity and the leg was elevated and the knee flexed.  The tourniquet was inflated to a pressure of 275 mmHg. The Esmarch was removed and the leg was brought down to full extension.  The patella and tibial tubercle identified and outlined using a marking pen and a midline skin incision was made with a knife carried through the subcutaneous tissue down to the extensor retinaculum.  After exposure of the extensor mechanism the medial parapatellar arthrotomy was performed with a scalpel and electrocautery extending down medial and distal to the tibial tubercle taking care to avoid incising the patellar tendon.   A standard medial release was performed over the proximal tibia.  The knee was brought into extension in order to excise the fat pad taking care not to damage the patella tendon.  The superior soft tissue was removed from the anterior surface of the distal femur to visualize for the procedure.  The knee was then brought into flexion with the patella subluxed laterally and subluxing the tibia anteriorly.  The ACL was transected and removed with electrocautery and additional soft tissue was removed from the proximal surface of the tibia to fully expose. The PCL was found to be intact and was partially preserved.  An extramedullary tibial cutting guide was then applied to the leg with a spring-loaded ankle clamp placed around the distal tibia just above the malleoli the angulation of  the guide was adjusted to give some posterior slope in the tibial resection with an appropriate varus/valgus alignment.  The resection guide was then pinned to the proximal tibia and the proximal tibial  surface was resected with an oscillating saw.  Careful attention was paid to ensure the blade did not disrupt any of the soft tissues including any lateral or medial ligament.  Attention was then turned to the femur, with the knee slightly flexed a opening drill was used to enter the medullary canal of the femur.  After removing the drill marrow was suctioned out to decompress the distal femur.  An intramedullary femoral guide was then inserted into the drill hole and the alignment guide was seated firmly against the distal end of the medial femoral condyle.  The distal femoral cutting guide was then attached and pinned securely to the anterior surface of the femur and the intramedullary rod and alignment guide was removed.  Distal femur resection was then performed with an oscillating saw with retractors protecting medial and laterally.   The distal cutting block was then removed and the extension gap was checked with a spacer.  Extension gap was found to be appropriately sized to accommodate the spacer block.   The femoral sizing guide was then placed securely into the posterior condyles of the femur and the femoral size was measured and determined to be 10.  The size 10; 4-in-1 cutting guide was placed in position and secured with 2 pins.  The anterior posterior and chamfer resections were then performed with an oscillating saw.  Bony fragments and osteophytes were then removed.  Using a lamina spreader the posterior medial and lateral condyles were checked for additional osteophytes and posterior soft tissue remnants.  Any remaining meniscus was removed at this time.  Periarticular injection was performed in the meniscal rims and posterior capsule with aspiration performed to ensure no intravascular injection.   The tibia was then exposed and the tibial trial was pinned onto the plateau after confirming appropriate orientation and rotation.  Using the drill bushing the tibia was prepared to the  appropriate drill depth.  Tibial broach impactor was then driven through the punch guide using a mallet.  The femoral trial component was then inserted onto the femur. A trial tibial polyethylene bearing was then placed and the knee was reduced.  The knee achieved full extension with no hyperextension and was found to be balanced in flexion and extension with the trials in place.  The knee was then brought into full extension the patella was everted and held with 2 Kocher clamps.  The articular surface of the patella was then resected with an patella reamer and saw after careful measurement with a caliper.  The patella was then prepared with the drill guide and a trial patella was placed.  The knee was then taken through range of motion and it was found that the patella articulated appropriately with the trochlea and good patellofemoral motion without subluxation.    The correct final components for implantation were confirmed and opened by the circulator nurse.  A bone plug was fashioned from the patient's bone cuts to plug the intramedullary femoral canal access hole and was tamped into place.  The prepared surfaces of the patella femur and tibia were cleaned with pulsatile lavage to remove all blood fat and other material and then the surfaces were dried.  2 bags of cement were mixed under vacuum and the components were cemented into place.  Excess cement was removed  with curettes and forceps. A trial polyethylene tibial component was placed and the knee was brought into extension to allow the cement to set.  At this time the periarticular injection cocktail was placed in the soft tissues surrounding the knee.  After full curing of the cement the balance of the knee was checked again and the final polyethylene size was confirmed. The tibial component was irrigated and locking mechanism checked to ensure it was clear of debris. The real polyethylene tibial component was implanted and the knee was brought  through a range of motion.   The knee was then irrigated with copious amount of normal saline via pulsatile lavage to remove all loose bodies and other debris.  The knee was then irrigated with surgiphor betadine based wash and reirrigated with saline.  The tourniquet was then dropped and all bleeding vessels were identified and coagulated.  The arthrotomy was approximated with #1 Vicryl and closed with #1 Stratafix suture.  The knee was brought into slight flexion and the subcutaneous tissues were closed with 0 Vicryl, 2-0 Vicryl and a running subcuticular 4-0 stratafix barbed suture.  Skin was then glued with Dermabond.  A sterile adhesive dressing was then placed along with a sequential compression device to the calf, a Ted stocking, and a cryotherapy cuff.   Sponge, needle, and Lap counts were all correct at the end of the case.   The patient was transferred off of the operating room table to a hospital bed, good pulses were found distally on the operative side.  The patient was transferred to the recovery room in stable condition.

## 2023-01-22 NOTE — Anesthesia Preprocedure Evaluation (Signed)
 Anesthesia Evaluation  Patient identified by MRN, date of birth, ID band Patient awake    Reviewed: Allergy & Precautions, H&P , NPO status , Patient's Chart, lab work & pertinent test results, reviewed documented beta blocker date and time   History of Anesthesia Complications Negative for: history of anesthetic complications  Airway Mallampati: I  TM Distance: >3 FB Neck ROM: full    Dental  (+) Upper Dentures, Edentulous Upper, Edentulous Lower, Dental Advidsory Given   Pulmonary neg shortness of breath, COPD,  COPD inhaler, neg recent URI, former smoker   Pulmonary exam normal breath sounds clear to auscultation       Cardiovascular Exercise Tolerance: Good hypertension, (-) angina (-) Past MI and (-) Cardiac Stents Normal cardiovascular exam(-) dysrhythmias (-) Valvular Problems/Murmurs Rhythm:regular Rate:Normal     Neuro/Psych negative neurological ROS  negative psych ROS   GI/Hepatic Neg liver ROS,GERD  ,,  Endo/Other  negative endocrine ROSdiabetes, Well Controlled, Type 2    Renal/GU negative Renal ROS  negative genitourinary   Musculoskeletal   Abdominal   Peds  Hematology negative hematology ROS (+)   Anesthesia Other Findings Past Medical History: No date: Anxiety No date: Aortic atherosclerosis (HCC) No date: Controlled type 2 diabetes mellitus without complication,  without long-term current use of insulin (HCC) No date: COPD (chronic obstructive pulmonary disease) (HCC)     Comment:  no inhalers No date: Dysphagia, unspecified type No date: ED (erectile dysfunction) No date: Former tobacco use No date: GERD (gastroesophageal reflux disease)     Comment:  occ No date: High cholesterol No date: Hypertension No date: IPMN (intraductal papillary mucinous neoplasm) No date: Pneumonia No date: Primary osteoarthritis of left knee No date: Sigmoid diverticulosis   Reproductive/Obstetrics negative  OB ROS                             Anesthesia Physical Anesthesia Plan  ASA: 2  Anesthesia Plan: Spinal   Post-op Pain Management:    Induction:   PONV Risk Score and Plan: 1 and Propofol  infusion and TIVA  Airway Management Planned: Natural Airway and Simple Face Mask  Additional Equipment:   Intra-op Plan:   Post-operative Plan:   Informed Consent: I have reviewed the patients History and Physical, chart, labs and discussed the procedure including the risks, benefits and alternatives for the proposed anesthesia with the patient or authorized representative who has indicated his/her understanding and acceptance.     Dental Advisory Given  Plan Discussed with: Anesthesiologist, CRNA and Surgeon  Anesthesia Plan Comments:        Anesthesia Quick Evaluation

## 2023-01-22 NOTE — Evaluation (Signed)
 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  Clinical Impression  Patient is agreeable to PT evaluation. He reports he was independent with mobility without assistive device prior to surgery. He lives alone but has supportive friends and neighbors who plan to assist as needed.  Patient able to complete bed mobility today with no physical assistance required. CGA provided for transfers and for short distance ambulation using rolling walker. Reinforced to use rolling walker with ambulation for safety. Mild increased pain reported in the left knee with standing. Recommend to continue PT to maximize independence in preparation for discharge home.       If plan is discharge home, recommend the following: Assist for transportation;Help with stairs or ramp for entrance   Can travel by private vehicle        Equipment Recommendations None recommended by PT  Recommendations for Other Services       Functional Status Assessment Patient has had a recent decline in their functional status and demonstrates the ability to make significant improvements in function in a reasonable and predictable amount of time.     Precautions / Restrictions Precautions Precautions: Fall;Knee Precaution Booklet Issued: Yes (comment) Restrictions Weight Bearing Restrictions Per Provider Order: Yes LLE Weight Bearing Per Provider Order: Weight bearing as tolerated      Mobility  Bed Mobility Overal bed mobility: Needs Assistance Bed Mobility: Supine to Sit, Sit to Supine     Supine to sit: Supervision Sit to supine: Supervision        Transfers Overall transfer level: Needs assistance Equipment used: Rolling walker (2 wheels) Transfers: Sit to/from Stand Sit to Stand: Contact guard assist            General transfer comment: cues for hand placement and LLE positioning with sitting for safety    Ambulation/Gait Ambulation/Gait assistance: Contact guard assist Gait Distance (Feet): 10 Feet Assistive device: Rolling walker (2 wheels) Gait Pattern/deviations: Step-to pattern, Decreased stride length, Antalgic, Decreased stance time - left Gait velocity: decreased     General Gait Details: reinforcement to use rolling walker for support  Stairs            Wheelchair Mobility     Tilt Bed    Modified Rankin (Stroke Patients Only)       Balance Overall balance assessment: Needs assistance Sitting-balance support: Feet supported Sitting balance-Leahy Scale: Good     Standing balance support: Bilateral upper extremity supported Standing balance-Leahy Scale: Fair                               Pertinent Vitals/Pain Pain Assessment Pain Assessment: 0-10 Pain Score: 2  Pain Location: L knee Pain Descriptors / Indicators: Discomfort Pain Intervention(s): Monitored during session, Limited activity within patient's tolerance, Repositioned    Home Living Family/patient expects to be discharged to:: Private residence Living Arrangements: Alone Available Help at Discharge: Friend(s);Neighbor;Available PRN/intermittently Type of Home: House Home Access: Stairs to enter Entrance Stairs-Rails: Right;Left (unable to reach both) Entrance Stairs-Number of Steps: 4   Home Layout: Two level;Able to live on main level with bedroom/bathroom Home Equipment: Rolling Walker (2 wheels);Cane - quad;Grab bars - tub/shower;Shower seat      Prior Function Prior Level of Function : Independent/Modified Independent;Driving  Mobility Comments: independent without device ADLs Comments: independent     Extremity/Trunk Assessment   Upper Extremity Assessment Upper Extremity Assessment: Overall WFL for tasks assessed    Lower Extremity  Assessment Lower Extremity Assessment: LLE deficits/detail LLE Deficits / Details: patient able to complete SLR and weight bear without knee buckling LLE Sensation: WNL       Communication   Communication Communication: No apparent difficulties  Cognition Arousal: Alert Behavior During Therapy: WFL for tasks assessed/performed Overall Cognitive Status: Within Functional Limits for tasks assessed                                          General Comments      Exercises Total Joint Exercises Goniometric ROM: L knee flexion 90 degrees in sitting position   Assessment/Plan    PT Assessment Patient needs continued PT services  PT Problem List Decreased strength;Decreased range of motion;Decreased activity tolerance;Decreased balance;Decreased mobility;Pain;Decreased knowledge of precautions;Decreased safety awareness       PT Treatment Interventions DME instruction;Gait training;Stair training;Functional mobility training;Therapeutic activities;Therapeutic exercise;Balance training;Neuromuscular re-education;Cognitive remediation;Patient/family education    PT Goals (Current goals can be found in the Care Plan section)  Acute Rehab PT Goals Patient Stated Goal: to go home tomorrow PT Goal Formulation: With patient Time For Goal Achievement: 02/05/23 Potential to Achieve Goals: Good    Frequency BID     Co-evaluation               AM-PAC PT 6 Clicks Mobility  Outcome Measure Help needed turning from your back to your side while in a flat bed without using bedrails?: None Help needed moving from lying on your back to sitting on the side of a flat bed without using bedrails?: A Little Help needed moving to and from a bed to a chair (including a wheelchair)?: A Little Help needed standing up from a chair using your arms (e.g., wheelchair or bedside chair)?: A Little Help needed to walk in hospital room?: A Little Help needed climbing 3-5 steps with a  railing? : A Little 6 Click Score: 19    End of Session Equipment Utilized During Treatment: Gait belt Activity Tolerance: Patient tolerated treatment well Patient left: in bed;with call bell/phone within reach;with SCD's reapplied (polar care reapplied) Nurse Communication: Mobility status PT Visit Diagnosis: Other abnormalities of gait and mobility (R26.89);Difficulty in walking, not elsewhere classified (R26.2)    Time: 1436-1500 PT Time Calculation (min) (ACUTE ONLY): 24 min   Charges:   PT Evaluation $PT Eval Low Complexity: 1 Low PT Treatments $Therapeutic Activity: 8-22 mins PT General Charges $$ ACUTE PT VISIT: 1 Visit         Randine Essex, PT, MPT   Randine LULLA Essex 01/22/2023, 3:39 PM

## 2023-01-22 NOTE — Interval H&P Note (Signed)
 Patient history and physical updated. Consent reviewed including risks, benefits, and alternatives to surgery. Patient agrees with above plan to proceed with left total knee arthroplasty.

## 2023-01-23 ENCOUNTER — Encounter: Payer: Self-pay | Admitting: Orthopedic Surgery

## 2023-01-23 DIAGNOSIS — M1712 Unilateral primary osteoarthritis, left knee: Secondary | ICD-10-CM | POA: Diagnosis not present

## 2023-01-23 LAB — BASIC METABOLIC PANEL
Anion gap: 7 (ref 5–15)
BUN: 11 mg/dL (ref 8–23)
CO2: 25 mmol/L (ref 22–32)
Calcium: 8.8 mg/dL — ABNORMAL LOW (ref 8.9–10.3)
Chloride: 106 mmol/L (ref 98–111)
Creatinine, Ser: 1.04 mg/dL (ref 0.61–1.24)
GFR, Estimated: >60 mL/min (ref >60)
Glucose, Bld: 102 mg/dL — ABNORMAL HIGH (ref 70–99)
Potassium: 3.9 mmol/L (ref 3.5–5.1)
Sodium: 138 mmol/L (ref 135–145)

## 2023-01-23 LAB — CBC
HCT: 37.2 % — ABNORMAL LOW (ref 39.0–52.0)
Hemoglobin: 12.8 g/dL — ABNORMAL LOW (ref 13.0–17.0)
MCH: 31.5 pg (ref 26.0–34.0)
MCHC: 34.4 g/dL (ref 30.0–36.0)
MCV: 91.6 fL (ref 80.0–100.0)
Platelets: 175 10*3/uL (ref 150–400)
RBC: 4.06 MIL/uL — ABNORMAL LOW (ref 4.22–5.81)
RDW: 12.2 % (ref 11.5–15.5)
WBC: 13.9 10*3/uL — ABNORMAL HIGH (ref 4.0–10.5)
nRBC: 0 % (ref 0.0–0.2)

## 2023-01-23 MED ORDER — DOCUSATE SODIUM 100 MG PO CAPS: 100.0000 mg | ORAL_CAPSULE | Freq: Two times a day (BID) | ORAL | 0 refills | Status: DC

## 2023-01-23 MED ORDER — PRAVASTATIN SODIUM 20 MG PO TABS
ORAL_TABLET | ORAL | Status: AC
  Filled 2023-01-23: qty 1

## 2023-01-23 MED ORDER — KETOROLAC TROMETHAMINE 15 MG/ML IJ SOLN
INTRAMUSCULAR | Status: AC
  Filled 2023-01-23: qty 1

## 2023-01-23 MED ORDER — PRAVASTATIN SODIUM 20 MG PO TABS
ORAL_TABLET | ORAL | Status: AC
  Filled 2023-01-23: qty 3

## 2023-01-23 MED ORDER — PANTOPRAZOLE SODIUM 40 MG PO TBEC
DELAYED_RELEASE_TABLET | ORAL | Status: AC
  Filled 2023-01-23: qty 1

## 2023-01-23 MED ORDER — OXYCODONE HCL 5 MG PO TABS: 2.5000 mg | ORAL_TABLET | Freq: Three times a day (TID) | ORAL | 0 refills | Status: DC | PRN

## 2023-01-23 MED ORDER — CELECOXIB 100 MG PO CAPS: 100.0000 mg | ORAL_CAPSULE | Freq: Two times a day (BID) | ORAL | 0 refills | Status: AC

## 2023-01-23 MED ORDER — TRAMADOL HCL 50 MG PO TABS: 50.0000 mg | ORAL_TABLET | Freq: Four times a day (QID) | ORAL | 0 refills | Status: DC | PRN

## 2023-01-23 MED ORDER — ENOXAPARIN SODIUM 40 MG/0.4ML IJ SOSY: 40.0000 mg | PREFILLED_SYRINGE | INTRAMUSCULAR | 0 refills | Status: DC

## 2023-01-23 MED ORDER — ONDANSETRON HCL 4 MG PO TABS: 4.0000 mg | ORAL_TABLET | Freq: Four times a day (QID) | ORAL | 0 refills | Status: DC | PRN

## 2023-01-23 MED ORDER — ENOXAPARIN SODIUM 30 MG/0.3ML IJ SOSY
PREFILLED_SYRINGE | INTRAMUSCULAR | Status: AC
  Filled 2023-01-23: qty 0.3

## 2023-01-23 MED ORDER — HYDROCODONE-ACETAMINOPHEN 5-325 MG PO TABS
ORAL_TABLET | ORAL | Status: AC
  Filled 2023-01-23: qty 2

## 2023-01-23 MED ORDER — ACETAMINOPHEN 500 MG PO TABS: 1000.0000 mg | ORAL_TABLET | Freq: Three times a day (TID) | ORAL | 0 refills | Status: DC

## 2023-01-23 MED ORDER — DOCUSATE SODIUM 100 MG PO CAPS
ORAL_CAPSULE | ORAL | Status: AC
  Filled 2023-01-23: qty 1

## 2023-01-23 MED ORDER — ACETAMINOPHEN 500 MG PO TABS
ORAL_TABLET | ORAL | Status: AC
Start: 2023-01-23 — End: ?
  Filled 2023-01-23: qty 2

## 2023-01-23 NOTE — Plan of Care (Signed)
?  Problem: Clinical Measurements: ?Goal: Respiratory complications will improve ?Outcome: Progressing ?  ?Problem: Activity: ?Goal: Risk for activity intolerance will decrease ?Outcome: Progressing ?  ?Problem: Elimination: ?Goal: Will not experience complications related to urinary retention ?Outcome: Progressing ?  ?

## 2023-01-23 NOTE — Care Management Obs Status (Signed)
 MEDICARE OBSERVATION STATUS NOTIFICATION   Patient Details  Name: Terry Sheppard MRN: [id number] Date of Birth: 1945-08-07   Medicare Observation Status Notification Given:  Yes    Sherilyn Banker 01/23/2023, 9:13 AM

## 2023-01-23 NOTE — Anesthesia Postprocedure Evaluation (Signed)
 Anesthesia Post Note  Patient: Terry Sheppard  Procedure(s) Performed: TOTAL KNEE ARTHROPLASTY (Left: Knee)  Patient location during evaluation: Nursing Unit Anesthesia Type: Spinal Level of consciousness: oriented and awake and alert Pain management: pain level controlled Vital Signs Assessment: post-procedure vital signs reviewed and stable Respiratory status: spontaneous breathing and respiratory function stable Cardiovascular status: blood pressure returned to baseline and stable Postop Assessment: no headache, no apparent nausea or vomiting and patient able to bend at knees Anesthetic complications: no   No notable events documented.   Last Vitals:  Vitals:   01/23/23 0800 01/23/23 0821  BP: (!) 148/72   Pulse: (!) 57   Resp: 16   Temp: 36.4 C   SpO2: 98% 100%    Last Pain:  Vitals:   01/23/23 0821  TempSrc:   PainSc: 0-No pain                 Leontine Katz P

## 2023-01-23 NOTE — Progress Notes (Signed)
 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    PT Comments  OOB and completes lap on unit, stair training, HEP and general education regarding TKA.  Overall progressing well with no further questions or concerns for acute stay.     If plan is discharge home, recommend the following: Assist for transportation;Help with stairs or ramp for entrance   Can travel by private vehicle        Equipment Recommendations  None recommended by PT    Recommendations for Other Services       Precautions / Restrictions Precautions Precautions: Fall;Knee Precaution Booklet Issued: Yes (comment) Restrictions Weight Bearing Restrictions Per Provider Order: Yes LLE Weight Bearing Per Provider Order: Weight bearing as tolerated     Mobility  Bed Mobility Overal bed mobility: Modified Independent                  Transfers Overall transfer level: Modified independent Equipment used: Rolling walker (2 wheels) Transfers: Sit to/from Stand Sit to Stand: Modified independent (Device/Increase time)                Ambulation/Gait Ambulation/Gait assistance: Supervision Gait Distance (Feet): 200 Feet Assistive device: Rolling walker (2 wheels) Gait Pattern/deviations: Step-to pattern, Decreased stride length, Antalgic, Decreased stance time - left Gait velocity: decreased         Stairs Stairs: Yes Stairs assistance: Supervision Stair Management: One rail Right, Step to pattern, Forwards Number of Stairs: 4     Wheelchair Mobility     Tilt Bed    Modified Rankin (Stroke Patients Only)       Balance Overall balance assessment: Needs assistance Sitting-balance support: Feet supported Sitting balance-Leahy Scale: Good     Standing balance support: Bilateral  upper extremity supported Standing balance-Leahy Scale: Fair                              Cognition Arousal: Alert Behavior During Therapy: WFL for tasks assessed/performed Overall Cognitive Status: Within Functional Limits for tasks assessed                                          Exercises Total Joint Exercises Goniometric ROM: 3-100 - limited by bed and dressings    General Comments        Pertinent Vitals/Pain Pain Assessment Pain Assessment: Faces Faces Pain Scale: Hurts a little bit Pain Location: L knee Pain Descriptors / Indicators: Tightness Pain Intervention(s): Monitored during session, Repositioned, Ice applied    Home Living                          Prior Function            PT Goals (current goals can now be found in the care plan section) Progress towards PT goals: Progressing toward goals    Frequency    BID      PT Plan      Co-evaluation              AM-PAC PT 6 Clicks Mobility   Outcome Measure  Help needed turning from your back to your side while in  a flat bed without using bedrails?: None Help needed moving from lying on your back to sitting on the side of a flat bed without using bedrails?: None Help needed moving to and from a bed to a chair (including a wheelchair)?: None Help needed standing up from a chair using your arms (e.g., wheelchair or bedside chair)?: None Help needed to walk in hospital room?: A Little Help needed climbing 3-5 steps with a railing? : A Little 6 Click Score: 22    End of Session Equipment Utilized During Treatment: Gait belt Activity Tolerance: Patient tolerated treatment well Patient left: in chair;with call bell/phone within reach Nurse Communication: Mobility status PT Visit Diagnosis: Other abnormalities of gait and mobility (R26.89);Difficulty in walking, not elsewhere classified (R26.2)     Time: 9174-9164 PT Time Calculation (min) (ACUTE  ONLY): 10 min  Charges:    $Gait Training: 8-22 mins PT General Charges $$ ACUTE PT VISIT: 1 Visit                   Lauraine Gills, PTA 01/23/23, 8:47 AM

## 2023-01-23 NOTE — Progress Notes (Signed)
   Subjective: 1 Day Post-Op Procedure(s) (LRB): TOTAL KNEE ARTHROPLASTY (Left) Patient reports pain as mild.   Patient is well, and has had no acute complaints or problems Denies any CP, SOB, ABD pain. We will continue therapy today.  Plan is to go Home after hospital stay.  Objective: Vital signs in last 24 hours: Temp:  [97.3 F (36.3 C)-98.5 F (36.9 C)] 97.8 F (36.6 C) (01/14 0513) Pulse Rate:  [50-60] 60 (01/14 0513) Resp:  [10-20] 16 (01/14 0513) BP: (109-141)/(55-81) 133/70 (01/14 0513) SpO2:  [91 %-100 %] 98 % (01/14 0513)  Intake/Output from previous day: 01/13 0701 - 01/14 0700 In: 1856.7 [P.O.:260; I.V.:1096.7; IV Piggyback:500] Out: 650 [Urine:600; Blood:50] Intake/Output this shift: No intake/output data recorded.  Recent Labs    01/23/23 0640  HGB 12.8*   Recent Labs    01/23/23 0640  WBC 13.9*  RBC 4.06*  HCT 37.2*  PLT 175   Recent Labs    01/23/23 0640  NA 138  K 3.9  CL 106  CO2 25  BUN 11  CREATININE 1.04  GLUCOSE 102*  CALCIUM  8.8*   No results for input(s): LABPT, INR in the last 72 hours.  EXAM General - Patient is Alert, Appropriate, and Oriented Extremity - Neurovascular intact Sensation intact distally Intact pulses distally Dorsiflexion/Plantar flexion intact No cellulitis present Compartment soft Dressing - dressing C/D/I and no drainage Motor Function - intact, moving foot and toes well on exam.   Past Medical History:  Diagnosis Date   Anxiety    Aortic atherosclerosis (HCC)    Controlled type 2 diabetes mellitus without complication, without long-term current use of insulin (HCC)    COPD (chronic obstructive pulmonary disease) (HCC)    no inhalers   Dysphagia, unspecified type    ED (erectile dysfunction)    Former tobacco use    GERD (gastroesophageal reflux disease)    occ   High cholesterol    Hypertension    IPMN (intraductal papillary mucinous neoplasm)    Pneumonia    Primary osteoarthritis of  left knee    Sigmoid diverticulosis     Assessment/Plan:   1 Day Post-Op Procedure(s) (LRB): TOTAL KNEE ARTHROPLASTY (Left) Principal Problem:   S/P TKR (total knee replacement), left  Estimated body mass index is 24.14 kg/m as calculated from the following:   Height as of this encounter: 5' 11 (1.803 m).   Weight as of this encounter: 78.5 kg. Advance diet Up with therapy  DVT Prophylaxis - Lovenox , TED hose, and SCDS Weight-Bearing as tolerated to left leg Pain well controlled Labs and VSS CM to assist with discharge to home with HHPT   T. Medford Amber, PA-C [facility] 01/23/2023, 8:18 AM

## 2023-01-23 NOTE — Plan of Care (Signed)
  Problem: Activity: Goal: Risk for activity intolerance will decrease Outcome: Progressing   Problem: Nutrition: Goal: Adequate nutrition will be maintained Outcome: Progressing   Problem: Pain Management: Goal: General experience of comfort will improve Outcome: Progressing   Problem: Safety: Goal: Ability to remain free from injury will improve Outcome: Progressing

## 2023-01-23 NOTE — Discharge Summary (Signed)
 Physician Discharge Summary  Patient ID: Terry Sheppard MRN: [id number] DOB/AGE: August 02, 1945 78 y.o.  Admit date: 01/22/2023 Discharge date: 01/23/2023  Admission Diagnoses:  S/P TKR (total knee replacement), left [Z96.652]   Discharge Diagnoses: Patient Active Problem List   Diagnosis Date Noted   S/P TKR (total knee replacement), left 01/22/2023   Former tobacco use 05/05/2021   GERD (gastroesophageal reflux disease) 05/05/2021   HLD (hyperlipidemia) 05/05/2021   Sigmoid diverticulosis 02/09/2020   IPMN (intraductal papillary mucinous neoplasm) 01/06/2020   Primary osteoarthritis of left knee 07/07/2018   Chronic obstructive pulmonary disease (HCC) 01/17/2018   Erectile dysfunction 01/17/2018   Aortic atherosclerosis (HCC) 11/08/2017   Controlled type 2 diabetes mellitus without complication, without long-term current use of insulin (HCC) 09/15/2015   Essential hypertension 04/15/2015    Past Medical History:  Diagnosis Date   Anxiety    Aortic atherosclerosis (HCC)    Controlled type 2 diabetes mellitus without complication, without long-term current use of insulin (HCC)    COPD (chronic obstructive pulmonary disease) (HCC)    no inhalers   Dysphagia, unspecified type    ED (erectile dysfunction)    Former tobacco use    GERD (gastroesophageal reflux disease)    occ   High cholesterol    Hypertension    IPMN (intraductal papillary mucinous neoplasm)    Pneumonia    Primary osteoarthritis of left knee    Sigmoid diverticulosis      Transfusion: none   Consultants (if any):   Discharged Condition: Improved  Hospital Course: Terry Sheppard is an 78 y.o. male who was admitted 01/22/2023 with a diagnosis of S/P TKR (total knee replacement), left and went to the operating room on 01/22/2023 and underwent the above named procedures.    Surgeries: Procedure(s): TOTAL KNEE ARTHROPLASTY on 01/22/2023 Patient tolerated the surgery well. Taken to PACU where she  was stabilized and then transferred to the orthopedic floor.  Started on Lovenox  30 mg q 12 hrs. TEDs and SCDs applied bilaterally. Heels elevated on bed. No evidence of DVT. Negative Homan. Physical therapy started on day #1 for gait training and transfer. OT started day #1 for ADL and assisted devices.  Patient's IV was d/c on day #1. Patient was able to safely and independently complete all PT goals. PT recommending discharge to home.    On post op day #1 patient was stable and ready for discharge to home with HHPT.  Implants: Femur: Persona Size 10 CR   Tibia: Persona Size G w/ 14x4mm stem extension  Poly: 10mm MC  Patella: 35x10mm symmetric   He was given perioperative antibiotics:  Anti-infectives (From admission, onward)    Start     Dose/Rate Route Frequency Ordered Stop   01/22/23 1400  ceFAZolin  (ANCEF ) IVPB 2g/100 mL premix        2 g 200 mL/hr over 30 Minutes Intravenous Every 6 hours 01/22/23 1116 01/22/23 2153   01/22/23 0615  ceFAZolin  (ANCEF ) IVPB 2g/100 mL premix        2 g 200 mL/hr over 30 Minutes Intravenous On call to O.R. 01/22/23 9387 01/22/23 0753     .  He was given sequential compression devices, early ambulation, and Lovenox  for DVT prophylaxis.  He benefited maximally from the hospital stay and there were no complications.    Recent vital signs:  Vitals:   01/23/23 0513 01/23/23 0821  BP: 133/70   Pulse: 60   Resp: 16   Temp: 97.8 F (36.6 C)  SpO2: 98% 100%    Recent laboratory studies:  Lab Results  Component Value Date   HGB 12.8 (L) 01/23/2023   HGB 16.7 12/11/2022   HGB 13.4 02/04/2022   Lab Results  Component Value Date   WBC 13.9 (H) 01/23/2023   PLT 175 01/23/2023   No results found for: INR Lab Results  Component Value Date   NA 138 01/23/2023   K 3.9 01/23/2023   CL 106 01/23/2023   CO2 25 01/23/2023   BUN 11 01/23/2023   CREATININE 1.04 01/23/2023   GLUCOSE 102 (H) 01/23/2023    Discharge Medications:    Allergies as of 01/23/2023   No Known Allergies      Medication List     TAKE these medications    acetaminophen  500 MG tablet Commonly known as: TYLENOL  Take 2 tablets (1,000 mg total) by mouth every 8 (eight) hours.   celecoxib  100 MG capsule Commonly known as: CeleBREX  Take 1 capsule (100 mg total) by mouth 2 (two) times daily for 7 days.   citalopram  20 MG tablet Commonly known as: CELEXA  Take 20 mg by mouth every morning.   docusate sodium  100 MG capsule Commonly known as: COLACE Take 1 capsule (100 mg total) by mouth 2 (two) times daily.   enoxaparin  40 MG/0.4ML injection Commonly known as: LOVENOX  Inject 0.4 mLs (40 mg total) into the skin daily for 14 days.   ondansetron  4 MG tablet Commonly known as: ZOFRAN  Take 1 tablet (4 mg total) by mouth every 6 (six) hours as needed for nausea.   oxyCODONE  5 MG immediate release tablet Commonly known as: Roxicodone  Take 0.5-1 tablets (2.5-5 mg total) by mouth every 8 (eight) hours as needed for breakthrough pain.   pravastatin  80 MG tablet Commonly known as: PRAVACHOL  Take 80 mg by mouth every morning.   traMADol  50 MG tablet Commonly known as: ULTRAM  Take 1 tablet (50 mg total) by mouth every 6 (six) hours as needed for moderate pain (pain score 4-6).        Diagnostic Studies: DG Knee Left Port Result Date: 01/22/2023 CLINICAL DATA:  Left total knee replacement. EXAM: PORTABLE LEFT KNEE - 1-2 VIEW COMPARISON:  None Available. FINDINGS: The left knee demonstrates a total knee arthroplasty without evidence of hardware failure or complication. There is expected intra-articular air. There is no fracture or dislocation. The alignment is anatomic. Post-surgical changes noted in the surrounding soft tissues. IMPRESSION: 1. Left total knee arthroplasty without immediate postoperative complication. Electronically Signed   By: Elsie ONEIDA Shoulder M.D.   On: 01/22/2023 10:51    Disposition:      Follow-up Information      Charlene Debby BROCKS, PA-C Follow up in 2 week(s).   Specialties: Orthopedic Surgery, Emergency Medicine Contact information: [address] [place] [address] [phone]                  Signed: CHARLENE DEBBY BRUCKNER 01/23/2023, 8:22 AM

## 2023-01-23 NOTE — Progress Notes (Signed)
 DISCHARGE NOTE:  Pt given discharge instructions and scripts. TED hose on both legs. Pt wheeled to car by staff, friend providing transportation.

## 2023-05-04 ENCOUNTER — Emergency Department
Admission: EM | Admit: 2023-05-04 | Discharge: 2023-05-04 | Disposition: A | Discharge status: Home / Self Care | Attending: Emergency Medicine | Admitting: Emergency Medicine

## 2023-05-04 ENCOUNTER — Emergency Department

## 2023-05-04 ENCOUNTER — Other Ambulatory Visit: Payer: Self-pay

## 2023-05-04 DIAGNOSIS — E119 Type 2 diabetes mellitus without complications: Secondary | ICD-10-CM | POA: Diagnosis not present

## 2023-05-04 DIAGNOSIS — E785 Hyperlipidemia, unspecified: Secondary | ICD-10-CM | POA: Insufficient documentation

## 2023-05-04 DIAGNOSIS — H538 Other visual disturbances: Secondary | ICD-10-CM | POA: Insufficient documentation

## 2023-05-04 DIAGNOSIS — J449 Chronic obstructive pulmonary disease, unspecified: Secondary | ICD-10-CM | POA: Insufficient documentation

## 2023-05-04 DIAGNOSIS — I491 Atrial premature depolarization: Secondary | ICD-10-CM | POA: Insufficient documentation

## 2023-05-04 DIAGNOSIS — I10 Essential (primary) hypertension: Secondary | ICD-10-CM | POA: Diagnosis not present

## 2023-05-04 DIAGNOSIS — I493 Ventricular premature depolarization: Secondary | ICD-10-CM | POA: Insufficient documentation

## 2023-05-04 DIAGNOSIS — R42 Dizziness and giddiness: Secondary | ICD-10-CM | POA: Insufficient documentation

## 2023-05-04 DIAGNOSIS — I6782 Cerebral ischemia: Secondary | ICD-10-CM | POA: Diagnosis not present

## 2023-05-04 LAB — COMPREHENSIVE METABOLIC PANEL WITH GFR
ALT: 13 U/L (ref 0–44)
AST: 22 U/L (ref 15–41)
Albumin: 4.1 g/dL (ref 3.5–5.0)
Alkaline Phosphatase: 117 U/L (ref 38–126)
Anion gap: 8 (ref 5–15)
BUN: 13 mg/dL (ref 8–23)
CO2: 26 mmol/L (ref 22–32)
Calcium: 9.2 mg/dL (ref 8.9–10.3)
Chloride: 100 mmol/L (ref 98–111)
Creatinine, Ser: 0.88 mg/dL (ref 0.61–1.24)
GFR, Estimated: >60 mL/min (ref >60)
Glucose, Bld: 122 mg/dL — ABNORMAL HIGH (ref 70–99)
Potassium: 3.5 mmol/L (ref 3.5–5.1)
Sodium: 134 mmol/L — ABNORMAL LOW (ref 135–145)
Total Bilirubin: 1.1 mg/dL (ref 0.0–1.2)
Total Protein: 7.4 g/dL (ref 6.5–8.1)

## 2023-05-04 LAB — URINALYSIS, ROUTINE W REFLEX MICROSCOPIC
Bilirubin Urine: NEGATIVE
Glucose, UA: NEGATIVE mg/dL
Hgb urine dipstick: NEGATIVE
Ketones, ur: NEGATIVE mg/dL
Leukocytes,Ua: NEGATIVE
Nitrite: NEGATIVE
Protein, ur: NEGATIVE mg/dL
Specific Gravity, Urine: 1.013 (ref 1.005–1.030)
pH: 6 (ref 5.0–8.0)

## 2023-05-04 LAB — DIFFERENTIAL
Abs Immature Granulocytes: 0.02 10*3/uL (ref 0.00–0.07)
Basophils Absolute: 0.1 10*3/uL (ref 0.0–0.1)
Basophils Relative: 1 %
Eosinophils Absolute: 0.1 10*3/uL (ref 0.0–0.5)
Eosinophils Relative: 1 %
Immature Granulocytes: 0 %
Lymphocytes Relative: 18 %
Lymphs Abs: 1.6 10*3/uL (ref 0.7–4.0)
Monocytes Absolute: 0.7 10*3/uL (ref 0.1–1.0)
Monocytes Relative: 8 %
Neutro Abs: 6.6 10*3/uL (ref 1.7–7.7)
Neutrophils Relative %: 72 %

## 2023-05-04 LAB — CBC
HCT: 50.1 % (ref 39.0–52.0)
Hemoglobin: 17.2 g/dL — ABNORMAL HIGH (ref 13.0–17.0)
MCH: 31.9 pg (ref 26.0–34.0)
MCHC: 34.3 g/dL (ref 30.0–36.0)
MCV: 92.8 fL (ref 80.0–100.0)
Platelets: 167 10*3/uL (ref 150–400)
RBC: 5.4 MIL/uL (ref 4.22–5.81)
RDW: 14.1 % (ref 11.5–15.5)
WBC: 9.1 10*3/uL (ref 4.0–10.5)
nRBC: 0 % (ref 0.0–0.2)

## 2023-05-04 LAB — PROTIME-INR
INR: 1 (ref 0.8–1.2)
Prothrombin Time: 13.5 s (ref 11.4–15.2)

## 2023-05-04 LAB — APTT: aPTT: 27 s (ref 24–36)

## 2023-05-04 NOTE — ED Triage Notes (Signed)
 Pt arrives from home for dizziness and vision changes that started at 0500 this morning. Pt states blurred vision in both eyes at time of triage. Pt doesn't take any prescribed medications.   BP from [facility] clinic 208/110

## 2023-05-04 NOTE — ED Provider Notes (Signed)
 [facility] Provider Note    Event Date/Time   First MD Initiated Contact with Patient 05/04/23 1057     (approximate)   History   Chief Complaint Eye Problem and Dizziness   HPI  Terry Sheppard is a 78 y.o. male with past medical history of hypertension, hyperlipidemia, diabetes, and COPD who presents to the ED complaining of blurry vision.  Patient reports that he was feeling well when he went to bed last night, then woke up this morning around 5 AM with blurry vision in both of his eyes.  He reports feeling slightly dizzy and unsteady with the symptoms, has not noticed any facial droop, difficulty speaking, numbness or weaknesses in extremities.  He states that his vision seems slightly improved by the time he has arrived to the ED, denies any history of similar episodes.  He has not had any fevers, cough, chest pain, shortness of breath, nausea, vomiting, diarrhea, or dysuria.      Physical Exam   Triage Vital Signs: ED Triage Vitals [05/04/23 1103]  Encounter Vitals Group     BP      Systolic BP Percentile      Diastolic BP Percentile      Pulse      Resp      Temp      Temp src      SpO2      Weight      Height      Head Circumference      Peak Flow      Pain Score 0     Pain Loc      Pain Education      Exclude from Growth Chart     Most recent vital signs: Vitals:   05/04/23 1110 05/04/23 1244  BP: (!) 160/92   Pulse: 87   Resp: 20   Temp:  97.9 F (36.6 C)  SpO2: 97%     Constitutional: Alert and oriented. Eyes: Conjunctivae are normal.  Pupils equal, round, and reactive to light bilaterally.  Extraocular movements intact. Head: Atraumatic. Nose: No congestion/rhinnorhea. Mouth/Throat: Mucous membranes are moist.  Cardiovascular: Normal rate, regular rhythm. Grossly normal heart sounds.  2+ radial pulses bilaterally. Respiratory: Normal respiratory effort.  No retractions. Lungs CTAB. Gastrointestinal: Soft and  nontender. No distention. Musculoskeletal: No lower extremity tenderness nor edema.  Neurologic:  Normal speech and language. No gross focal neurologic deficits are appreciated.    ED Results / Procedures / Treatments   Labs (all labs ordered are listed, but only abnormal results are displayed) Labs Reviewed  CBC - Abnormal; Notable for the following components:      Result Value   Hemoglobin 17.2 (*)    All other components within normal limits  COMPREHENSIVE METABOLIC PANEL WITH GFR - Abnormal; Notable for the following components:   Sodium 134 (*)    Glucose, Bld 122 (*)    All other components within normal limits  URINALYSIS, ROUTINE W REFLEX MICROSCOPIC - Abnormal; Notable for the following components:   Color, Urine YELLOW (*)    APPearance CLEAR (*)    All other components within normal limits  PROTIME-INR  APTT  DIFFERENTIAL     EKG  ED ECG REPORT I, Twilla Galea, the attending physician, personally viewed and interpreted this ECG.   Date: 05/04/2023  EKG Time: 11:05  Rate: 94  Rhythm: normal sinus rhythm, occasional PACs and PVCs  Axis: Normal  Intervals:none  ST&T Change: None  RADIOLOGY CT head reviewed and interpreted by me with no hemorrhage or midline shift.  PROCEDURES:  Critical Care performed: No  Procedures   MEDICATIONS ORDERED IN ED: Medications - No data to display   IMPRESSION / MDM / ASSESSMENT AND PLAN / ED COURSE  I reviewed the triage vital signs and the nursing notes.                              78 y.o. male with past medical history of hypertension, hyperlipidemia, diabetes, and COPD who presents to the ED with blurry vision and some dizziness since waking up this morning.  Patient's presentation is most consistent with acute presentation with potential threat to life or bodily function.  Differential diagnosis includes, but is not limited to, stroke, TIA, arrhythmia, electrolyte abnormality, AKI, hypoglycemia,  peripheral vertigo.  Patient nontoxic-appearing and in no acute distress, vital signs are unremarkable.  He does not have any apparent focal neurologic deficits on exam and is outside the window for acute intervention on potential stroke given last known well time of last night.  We will initiate stroke workup with CT imaging of his head and labs, no findings concerning for LVO.  He does state that vision has been improving since onset of symptoms.  CT head is negative for acute process, labs without significant anemia, leukocytosis, electrolyte abnormality, or AKI.  LFTs are also unremarkable.  I recommended patient stay in the ED for MRI to rule out acute stroke, however he states that his symptoms are resolving and is requesting to be discharged home.  He expresses understanding that we cannot completely exclude acute stroke, assures me he will return to the ED for any new or worsening symptoms.  Referral provided to ophthalmology and he was counseled to follow-up with his PCP.  Patient agrees with plan.      FINAL CLINICAL IMPRESSION(S) / ED DIAGNOSES   Final diagnoses:  Blurry vision, bilateral  Dizziness     Rx / DC Orders   ED Discharge Orders     None        Note:  This document was prepared using Dragon voice recognition software and may include unintentional dictation errors.   Twilla Galea, MD 05/04/23 [phone]

## 2023-06-27 ENCOUNTER — Emergency Department

## 2023-06-27 ENCOUNTER — Other Ambulatory Visit: Payer: Self-pay

## 2023-06-27 ENCOUNTER — Observation Stay
Admission: EM | Admit: 2023-06-27 | Discharge: 2023-06-29 | Disposition: A | Discharge status: Home / Self Care | Attending: Internal Medicine | Admitting: Internal Medicine

## 2023-06-27 DIAGNOSIS — Z96652 Presence of left artificial knee joint: Secondary | ICD-10-CM | POA: Diagnosis not present

## 2023-06-27 DIAGNOSIS — E119 Type 2 diabetes mellitus without complications: Secondary | ICD-10-CM | POA: Diagnosis not present

## 2023-06-27 DIAGNOSIS — I639 Cerebral infarction, unspecified: Secondary | ICD-10-CM | POA: Diagnosis not present

## 2023-06-27 DIAGNOSIS — Z79899 Other long term (current) drug therapy: Secondary | ICD-10-CM | POA: Diagnosis not present

## 2023-06-27 DIAGNOSIS — F0390 Unspecified dementia without behavioral disturbance: Secondary | ICD-10-CM | POA: Insufficient documentation

## 2023-06-27 DIAGNOSIS — E785 Hyperlipidemia, unspecified: Secondary | ICD-10-CM | POA: Insufficient documentation

## 2023-06-27 DIAGNOSIS — R42 Dizziness and giddiness: Secondary | ICD-10-CM | POA: Diagnosis present

## 2023-06-27 DIAGNOSIS — I10 Essential (primary) hypertension: Secondary | ICD-10-CM | POA: Diagnosis present

## 2023-06-27 DIAGNOSIS — Z87891 Personal history of nicotine dependence: Secondary | ICD-10-CM | POA: Insufficient documentation

## 2023-06-27 DIAGNOSIS — R41841 Cognitive communication deficit: Secondary | ICD-10-CM | POA: Insufficient documentation

## 2023-06-27 DIAGNOSIS — J449 Chronic obstructive pulmonary disease, unspecified: Secondary | ICD-10-CM | POA: Diagnosis not present

## 2023-06-27 DIAGNOSIS — M25512 Pain in left shoulder: Secondary | ICD-10-CM | POA: Diagnosis not present

## 2023-06-27 LAB — HEMOGLOBIN A1C
Hgb A1c MFr Bld: 5.8 % — ABNORMAL HIGH (ref 4.8–5.6)
Mean Plasma Glucose: 119.76 mg/dL

## 2023-06-27 LAB — LIPID PANEL
Cholesterol: 247 mg/dL — ABNORMAL HIGH (ref 0–200)
HDL: 86 mg/dL (ref >40)
LDL Cholesterol: 149 mg/dL — ABNORMAL HIGH (ref 0–99)
Total CHOL/HDL Ratio: 2.9 ratio
Triglycerides: 61 mg/dL (ref <150)
VLDL: 12 mg/dL (ref 0–40)

## 2023-06-27 LAB — COMPREHENSIVE METABOLIC PANEL WITH GFR
ALT: 12 U/L (ref 0–44)
AST: 16 U/L (ref 15–41)
Albumin: 3.9 g/dL (ref 3.5–5.0)
Alkaline Phosphatase: 66 U/L (ref 38–126)
Anion gap: 10 (ref 5–15)
BUN: 14 mg/dL (ref 8–23)
CO2: 25 mmol/L (ref 22–32)
Calcium: 8.8 mg/dL — ABNORMAL LOW (ref 8.9–10.3)
Chloride: 104 mmol/L (ref 98–111)
Creatinine, Ser: 0.84 mg/dL (ref 0.61–1.24)
GFR, Estimated: >60 mL/min (ref >60)
Glucose, Bld: 105 mg/dL — ABNORMAL HIGH (ref 70–99)
Potassium: 3.3 mmol/L — ABNORMAL LOW (ref 3.5–5.1)
Sodium: 139 mmol/L (ref 135–145)
Total Bilirubin: 0.8 mg/dL (ref 0.0–1.2)
Total Protein: 6.6 g/dL (ref 6.5–8.1)

## 2023-06-27 LAB — CBC
HCT: 41.4 % (ref 39.0–52.0)
Hemoglobin: 14.1 g/dL (ref 13.0–17.0)
MCH: 32.8 pg (ref 26.0–34.0)
MCHC: 34.1 g/dL (ref 30.0–36.0)
MCV: 96.3 fL (ref 80.0–100.0)
Platelets: 202 10*3/uL (ref 150–400)
RBC: 4.3 MIL/uL (ref 4.22–5.81)
RDW: 15.5 % (ref 11.5–15.5)
WBC: 7.7 10*3/uL (ref 4.0–10.5)
nRBC: 0 % (ref 0.0–0.2)

## 2023-06-27 LAB — URINALYSIS, W/ REFLEX TO CULTURE (INFECTION SUSPECTED)
Bacteria, UA: NONE SEEN
Bilirubin Urine: NEGATIVE
Glucose, UA: NEGATIVE mg/dL
Hgb urine dipstick: NEGATIVE
Ketones, ur: 5 mg/dL — AB
Leukocytes,Ua: NEGATIVE
Nitrite: NEGATIVE
Protein, ur: NEGATIVE mg/dL
Specific Gravity, Urine: 1.019 (ref 1.005–1.030)
pH: 5 (ref 5.0–8.0)

## 2023-06-27 LAB — CBG MONITORING, ED: Glucose-Capillary: 97 mg/dL (ref 70–99)

## 2023-06-27 LAB — TROPONIN I (HIGH SENSITIVITY): Troponin I (High Sensitivity): 8 ng/L (ref <18)

## 2023-06-27 MED ORDER — PRAVASTATIN SODIUM 20 MG PO TABS
80.0000 mg | ORAL_TABLET | ORAL | Status: DC
  Administered 2023-06-28: 80 mg via ORAL
  Filled 2023-06-27: qty 4

## 2023-06-27 MED ORDER — ASPIRIN 81 MG PO CHEW
324.0000 mg | CHEWABLE_TABLET | Freq: Once | ORAL | Status: AC
  Administered 2023-06-27: 324 mg via ORAL
  Filled 2023-06-27: qty 4

## 2023-06-27 MED ORDER — HYDRALAZINE HCL 50 MG PO TABS: 25.0000 mg | ORAL_TABLET | Freq: Three times a day (TID) | ORAL | Status: DC | PRN

## 2023-06-27 MED ORDER — ACETAMINOPHEN 500 MG PO TABS
1000.0000 mg | ORAL_TABLET | Freq: Three times a day (TID) | ORAL | Status: DC
  Administered 2023-06-27 – 2023-06-29 (×6): 1000 mg via ORAL
  Filled 2023-06-27 (×6): qty 2

## 2023-06-27 MED ORDER — TRAMADOL HCL 50 MG PO TABS: 50.0000 mg | ORAL_TABLET | Freq: Four times a day (QID) | ORAL | Status: DC | PRN

## 2023-06-27 NOTE — ED Notes (Signed)
 Upon entering the room pt states that they are here because they are here because of left shoulder pain. Pt's daughter who is the POA is also the phone stated that he was here because he went to his PCP provider who sent him to the clinic, who sent him over here to get checked out. Pt's daughter says that he was dizzy and had blurred vision when he was on the way over to his doctor. Daughter is also concerned about memory issues that may be increasing over the last several months.

## 2023-06-27 NOTE — ED Provider Notes (Signed)
 [facility] Provider Note    Event Date/Time   First MD Initiated Contact with Patient 06/27/23 1037     (approximate)   History   Blurred Vision and Dizziness   HPI  Terry Sheppard is a 78 year old male presenting to the emergency department for evaluation of shoulder pain and dizziness.  Patient reports that for the past few weeks he has had some ongoing left shoulder pain.  Has not seen a doctor for it, denies any identifiable trauma.  Denies chest pain.  He additionally reports that while he was driving to the clinic today he had onset of dizziness described as a spinning sensation with associated blurry vision.  In triage noted right eye blurred vision, tells me he does not remember being in one eye, does feel that it is now improved.  Daughter provides collateral history over phone.  She reports that for the past several months, patient has had progressive cognitive decline and she is awaiting primary care follow-up to discuss neurological evaluation.  She does note that patient has been driving to his primary care office at inappropriate times and does think that he has been complained about dizziness for at least the past few days.  I did review his ER visit from 05/04/2023.  At that time, patient presented with dizziness with bilateral blurry vision.  Negative head CT at that time.  An MRI was recommended, but patient reported improved symptoms and was to be discharged prior to completion of this.    Physical Exam   Triage Vital Signs: ED Triage Vitals  Encounter Vitals Group     BP 06/27/23 0908 (!) 186/135     Girls Systolic BP Percentile --      Girls Diastolic BP Percentile --      Boys Systolic BP Percentile --      Boys Diastolic BP Percentile --      Pulse Rate 06/27/23 0908 76     Resp 06/27/23 0908 19     Temp 06/27/23 0911 98.2 F (36.8 C)     Temp Source 06/27/23 0911 Oral     SpO2 06/27/23 0908 100 %     Weight 06/27/23 0911 173 lb  1 oz (78.5 kg)     Height 06/27/23 0911 5' 11 (1.803 m)     Head Circumference --      Peak Flow --      Pain Score 06/27/23 0909 10     Pain Loc --      Pain Education --      Exclude from Growth Chart --     Most recent vital signs: Vitals:   06/27/23 1306 06/27/23 1432  BP: (!) 168/88 (!) 173/92  Pulse:  65  Resp: 19 18  Temp:  97.6 F (36.4 C)  SpO2: 100% 100%     General: Awake, interactive  CV:  Bradycardic with regular rhythm, good peripheral perfusion Resp:  Unlabored respirations, lungs clear to auscultation Abd:  Nondistended, soft, nontender Neuro:  Neuro: Keenly aware, correctly answers month and age, able to blink eyes and squeeze hands, normal horizontal extraocular movements, no visual field loss, normal facial symmetry, limited range of motion of the left upper extremity secondary to pain in the shoulder, but no appreciable weakness of the distal extremity, no drift of the right upper and bilateral lower extremities, no limb ataxia, normal sensation, no aphasia, no dysarthria, no inattention. MSK:  No significant tenderness to palpation of the shoulder, patient does  have pain with range of motion at the shoulder, no tenderness of the distal extremity.  2+ radial pulses    ED Results / Procedures / Treatments   Labs (all labs ordered are listed, but only abnormal results are displayed) Labs Reviewed  COMPREHENSIVE METABOLIC PANEL WITH GFR - Abnormal; Notable for the following components:      Result Value   Potassium 3.3 (*)    Glucose, Bld 105 (*)    Calcium 8.8 (*)    All other components within normal limits  URINALYSIS, W/ REFLEX TO CULTURE (INFECTION SUSPECTED) - Abnormal; Notable for the following components:   Color, Urine YELLOW (*)    APPearance CLEAR (*)    Ketones, ur 5 (*)    All other components within normal limits  CBC  CBG MONITORING, ED  TROPONIN I (HIGH SENSITIVITY)     EKG EKG independently reviewed and interpreted by myself  demonstrates:    RADIOLOGY Imaging independently reviewed and interpreted by myself demonstrates:  Shoulder x-Furkan Keenum without acute findings CT head without acute bleed, does demonstrate chronic ischemic changes MRI brain with questionable small acute subcortical infarcts  Formal Radiology Read:  MR BRAIN WO CONTRAST Result Date: 06/27/2023 CLINICAL DATA:  Transient ischemic attack (TIA) EXAM: MRI HEAD WITHOUT CONTRAST TECHNIQUE: Multiplanar, multiecho pulse sequences of the brain and surrounding structures were obtained without intravenous contrast. COMPARISON:  CT head dated June 27, 2023. FINDINGS: Brain: There are punctate foci of high diffusion signal within the subcortical white matter of the right frontal lobe on image 40 of series 5 and there is a focus of high signal present within the subcortical white matter of the left posterior frontal lobe on image 43. There is extensive diffuse cerebral white matter disease also present. There are chronic lacunar infarcts within the periventricular and deep cerebral white matter and within the right superior cerebellar hemisphere and left paracentral pons. There are numerous chronic microhemorrhages present within the subcortical white matter of the cerebral hemispheres and within the right basal ganglia and left thalamus. There also few scattered microhemorrhages within the cerebellar hemispheres bilaterally. Vascular: Normal vascular flow voids. Skull and upper cervical spine: Normal in signal intensity. No lesions are evident. Sinuses/Orbits: Opacification of the right posterior ethmoid air cells. The orbits are unremarkable. Other: None. IMPRESSION: 1. Questionable small acute subcortical infarcts within the frontal lobes bilaterally. 2. 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 Electronically Signed   By: Maribeth Shivers M.D.   On:  06/27/2023 13:11   DG Shoulder Left Result Date: 06/27/2023 CLINICAL DATA:  Shoulder pain.  No reported trauma EXAM: LEFT SHOULDER - 3 VIEW COMPARISON:  None Available. FINDINGS: No fracture or dislocation.  Preserved joint spaces.  Osteopenia. IMPRESSION: No acute osseous abnormality. Electronically Signed   By: Adrianna Horde M.D.   On: 06/27/2023 11:07   CT HEAD WO CONTRAST ( ) Result Date: 06/27/2023 CLINICAL DATA:  Syncope/presyncope. Presented with dizziness and right eye blurred vision upon waking up this morning. EXAM: CT HEAD WITHOUT CONTRAST TECHNIQUE: Contiguous axial images were obtained from the base of the skull through the vertex without intravenous contrast. RADIATION DOSE REDUCTION: This exam was performed according to the departmental dose-optimization program which includes automated exposure control, adjustment of the mA and/or kV according to patient size and/or use of iterative reconstruction technique. COMPARISON:  CT head 05/04/2023. FINDINGS: Brain: No acute intracranial hemorrhage. No CT evidence of acute infarct.  Nonspecific hypoattenuation in the periventricular and subcortical white matter favored to reflect chronic microvascular ischemic changes. 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 Additional small remote infarct in the superior right cerebellum. No edema, mass effect, or midline shift. The basilar cisterns are patent. Ventricles: The ventricles are normal. Vascular: Atherosclerotic calcifications of the carotid siphons and intracranial vertebral arteries. No hyperdense vessel. Skull: No acute or aggressive finding. Orbits: Orbits are symmetric. Sinuses: Mucosal thickening in the right posterior ethmoid air cells. Other: Mastoid air cells are clear. IMPRESSION: No CT evidence of acute intracranial abnormality. Moderate chronic microvascular ischemic changes and parenchymal volume loss. 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 Electronically Signed   By: Denny Flack M.D.   On: 06/27/2023 10:47    PROCEDURES:  Critical Care performed: No  Procedures   MEDICATIONS ORDERED IN ED: Medications - No data to display   IMPRESSION / MDM / ASSESSMENT AND PLAN / ED COURSE  I reviewed the triage vital signs and the nursing notes.  Differential diagnosis includes, but is not limited to, CVA, TIA, anemia, electrolyte abnormality, UTI, dementia or other progressive cognitive impairment, likely musculoskeletal strain of shoulder, much lower suspicion ACS  Patient's presentation is most consistent with acute presentation with potential threat to life or bodily function.  78 year old male presenting to the emergency department for evaluation of left shoulder pain and dizziness.  Clinical Course as of 06/27/23 1552  Wed Jun 27, 2023  1320 MRI brain with questionable acute infarcts of the frontal lobe.  Will discuss with neurology. [NR]  1410 Case reviewed with Dr. Doretta Gant.  She will review the patient's images and provide further recommendations. [NR]  1539 Neurology discussion delayed due to multiple code strokes presenting to ER, signed out to oncoming physician at 1510 pending discussion with neurology and disposition. [NR]    Clinical Course User Index [NR] Claria Crofts, MD     FINAL CLINICAL IMPRESSION(S) / ED DIAGNOSES   Final diagnoses:  Dizziness  Acute pain of left shoulder     Rx / DC Orders   ED Discharge Orders     None        Note:  This document was prepared using Dragon voice recognition software and may include unintentional dictation errors.   Claria Crofts, MD 06/27/23 [phone]

## 2023-06-27 NOTE — ED Provider Notes (Signed)
 MR BRAIN WO CONTRAST Result Date: 06/27/2023 CLINICAL DATA:  Transient ischemic attack (TIA) EXAM: MRI HEAD WITHOUT CONTRAST TECHNIQUE: Multiplanar, multiecho pulse sequences of the brain and surrounding structures were obtained without intravenous contrast. COMPARISON:  CT head dated June 27, 2023. FINDINGS: Brain: There are punctate foci of high diffusion signal within the subcortical white matter of the right frontal lobe on image 40 of series 5 and there is a focus of high signal present within the subcortical white matter of the left posterior frontal lobe on image 43. There is extensive diffuse cerebral white matter disease also present. There are chronic lacunar infarcts within the periventricular and deep cerebral white matter and within the right superior cerebellar hemisphere and left paracentral pons. There are numerous chronic microhemorrhages present within the subcortical white matter of the cerebral hemispheres and within the right basal ganglia and left thalamus. There also few scattered microhemorrhages within the cerebellar hemispheres bilaterally. Vascular: Normal vascular flow voids. Skull and upper cervical spine: Normal in signal intensity. No lesions are evident. Sinuses/Orbits: Opacification of the right posterior ethmoid air cells. The orbits are unremarkable. Other: None. IMPRESSION: 1. Questionable small acute subcortical infarcts within the frontal lobes bilaterally. 2. 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 Electronically Signed   By: Maribeth Shivers M.D.   On: 06/27/2023 13:11    ----------------------------------------- 5:35 PM on 06/27/2023 ----------------------------------------- Patient initially insistent that he needed to leave to go take care of his cats, called patient's daughter, she will be on her way up to care for him and his animals.  She  advises that she will take care of the animals.  Patient understanding of this.  He is ultimately agreeable to stay for further workup of what is suspected to be potentially new acute small subcortical infarct.  Discussed case with Dr. Doretta Gant, neuro consult to follow tomorrow. Ok for diet and aspirin for now. (Passed swallow screen)  Consulted with and patient accepted to hospitalist service by Dr. Tamala Fair, MD 06/27/23 2027

## 2023-06-27 NOTE — ED Notes (Signed)
 Fall precautions taken. Fall bracelet applied to pt. Pt has personal shoe on at this time. Bed alarm on.

## 2023-06-27 NOTE — H&P (Signed)
 History and Physical    Patient: Terry Sheppard ZOX:[id number] DOB: May 07, 1945 DOA: 06/27/2023 DOS: the patient was seen and examined on 06/27/2023 PCP: Morton Areas, PA-C  Patient coming from: Home  Chief Complaint:  Chief Complaint  Patient presents with   Blurred Vision   Dizziness   HPI: Terry Sheppard is a 78 y.o. male with medical history significant of COPD, HTN,GERD,OA, HL comes to the emergency room for complains of vision problem after he woke up today. Patient drove himself up. He tells me he has issues of memory lately. He also has been complaining of left shoulder pain. He reports he is not taking any medication for a long time and does not have a primary care. He lives in [place] alone.   Patient's blood pressure is elevated the ER. MRI showed1. Questionable small acute subcortical infarcts within the frontal lobes bilaterally. 2. 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  He passed the swallow test and was given aspirin to chew. Patient is being admitted for acute CVA. Neurology Dr. Doretta Gant is aware   Review of Systems: As mentioned in the history of present illness. All other systems reviewed and are negative. Past Medical History:  Diagnosis Date   Anxiety    Aortic atherosclerosis (HCC)    Controlled type 2 diabetes mellitus without complication, without long-term current use of insulin (HCC)    COPD (chronic obstructive pulmonary disease) (HCC)    no inhalers   Dysphagia, unspecified type    ED (erectile dysfunction)    Former tobacco use    GERD (gastroesophageal reflux disease)    occ   High cholesterol    Hypertension    IPMN (intraductal papillary mucinous neoplasm)    Pneumonia    Primary osteoarthritis of left knee    Sigmoid diverticulosis    Past Surgical History:  Procedure Laterality Date   COLONOSCOPY      HERNIA REPAIR     INGUINAL HERNIA REPAIR Right 02/18/2021   Procedure: HERNIA REPAIR INGUINAL ADULT WITH MESH;  Surgeon: Conrado Delay, DO;  Location: [facility] ORS;  Service: General;  Laterality: Right;   TONSILLECTOMY     TOTAL KNEE ARTHROPLASTY Left 01/22/2023   Procedure: TOTAL KNEE ARTHROPLASTY;  Surgeon: Venus Ginsberg, MD;  Location: [facility] OR[facility];  Service: Orthopedics;  Laterality: Left;   WISDOM TOOTH EXTRACTION     XI ROBOTIC ASSISTED INGUINAL HERNIA REPAIR WITH MESH Right 06/13/2019   Procedure: XI ROBOTIC ASSISTED INGUINAL HERNIA REPAIR WITH MESH;  Surgeon: Conrado Delay, DO;  Location: [facility] OR[facility];  Service: General;  Laterality: Right;   XI ROBOTIC ASSISTED INGUINAL HERNIA REPAIR WITH MESH Left 02/03/2022   Procedure: XI ROBOTIC ASSISTED INGUINAL HERNIA REPAIR WITH MESH;  Surgeon: Conrado Delay, DO;  Location: [facility] OR[facility];  Service: General;  Laterality: Left;   Social History:  reports that he quit smoking about 27 years ago. His smoking use included cigarettes. He started smoking about 67 years ago. He has a 80 pack-year smoking history. He has never used smokeless tobacco. He reports current alcohol use of about 2.0 - 3.0 standard drinks of alcohol per week. He reports that he does not use drugs.  No Known Allergies  No family history on file.  Prior to Admission medications   Medication Sig Start Date End Date Taking? Authorizing Provider  acetaminophen  (TYLENOL ) 500 MG tablet Take 2 tablets (1,000 mg total) by mouth every 8 (eight) hours.  01/23/23   Coralyn Derry, PA-C  citalopram  (CELEXA ) 20 MG tablet Take 20 mg by mouth every morning. 12/21/22 12/21/23  [provider]  docusate sodium  (COLACE) 100 MG capsule Take 1 capsule (100 mg total) by mouth 2 (two) times daily. 01/23/23   Coralyn Derry, PA-C  enoxaparin  (LOVENOX ) 40 MG/0.4ML injection Inject 0.4 mLs (40 mg total) into the skin daily for 14 days. 01/23/23 02/06/23  Coralyn Derry, PA-C  ondansetron  (ZOFRAN ) 4 MG  tablet Take 1 tablet (4 mg total) by mouth every 6 (six) hours as needed for nausea. 01/23/23   Coralyn Derry, PA-C  oxyCODONE  (ROXICODONE ) 5 MG immediate release tablet Take 0.5-1 tablets (2.5-5 mg total) by mouth every 8 (eight) hours as needed for breakthrough pain. 01/23/23 01/23/24  Coralyn Derry, PA-C  pravastatin  (PRAVACHOL ) 80 MG tablet Take 80 mg by mouth every morning.    [provider]  traMADol  (ULTRAM ) 50 MG tablet Take 1 tablet (50 mg total) by mouth every 6 (six) hours as needed for moderate pain (pain score 4-6). 01/23/23   Coralyn Derry, PA-C    Physical Exam: Vitals:   06/27/23 1130 06/27/23 1200 06/27/23 1306 06/27/23 1432  BP: (!) 173/83 (!) 161/98 (!) 168/88 (!) 173/92  Pulse: 66 (!) 47  65  Resp: 15 12 19 18   Temp:    97.6 F (36.4 C)  TempSrc:    Oral  SpO2:  98% 100% 100%  Weight:      Height:       Physical Exam Constitutional:      Appearance: Normal appearance.   Eyes:     Extraocular Movements: Extraocular movements intact.     Pupils: Pupils are equal, round, and reactive to light.    Cardiovascular:     Rate and Rhythm: Normal rate and regular rhythm.  Abdominal:     General: Abdomen is flat.     Palpations: Abdomen is soft.   Skin:    General: Skin is warm and dry.   Neurological:     General: No focal deficit present.     Mental Status: He is alert. He is disoriented.     Comments: Memory issue     Assessment and Plan: Terry Sheppard is a 78 y.o. male with medical history significant of COPD, HTN,GERD,OA, HL comes to the emergency room for complains of vision problem after he woke up today. Patient drove himself up. He tells me he has issues of memory lately. He also has been complaining of left shoulder pain. He reports he is not taking any medication for a long time and does not have a primary care.  Acute CVA-- bilateral small infarct in the frontal lobe -- admit patient to medical telemetry -- aspirin 325 daily --  start statins -- neurology consultation-- Dr. Doretta Gant aware-- ER MD spoke -- echo of the heart, PT OT -- further workup per neurology recommendation -- check lipid profile A1c -- echo of the heart  Uncontrolled hypertension -- in the setting of stroke will allow permissive hypertension. -- PRN hydralazine for systolic greater than 180 diastolic greater than 110  Memory issues/cognitive deficit/early dementia -- will need formal evaluation as outpatient  Left shoulder pain with restricted range of motion questionable frozen shoulder -- x-ray unremarkable -- PRN pain meds -- will refer to orthopedic at discharge       Advance Care Planning:   Code Status: Full Code   Consults: Neurology  Family Communication: none  today. ER MD spoke with dter on the phone  Severity of Illness: The appropriate patient status for this patient is OBSERVATION. Observation status is judged to be reasonable and necessary in order to provide the required intensity of service to ensure the patient's safety. The patient's presenting symptoms, physical exam findings, and initial radiographic and laboratory data in the context of their medical condition is felt to place them at decreased risk for further clinical deterioration. Furthermore, it is anticipated that the patient will be medically stable for discharge from the hospital within 2 midnights of admission.   Author: Melvinia Stager, MD 06/27/2023 6:01 PM  For on call review [email or web address].

## 2023-06-27 NOTE — ED Triage Notes (Signed)
 Pt here with dizziness and right eye blurred vision after he woke up this morning. Pt denies weakness. Face symmetrical, pt states everything was fine when he woke up and this all started on his way to the ED while driving. Pt has hx of memory issues, states LKW was last night when he went to bed at 2100. EDP in triage to evaluate pt at this time.

## 2023-06-28 ENCOUNTER — Other Ambulatory Visit

## 2023-06-28 ENCOUNTER — Observation Stay
Admit: 2023-06-28 | Discharge: 2023-06-28 | Disposition: A | Discharge status: Home / Self Care | Attending: Internal Medicine | Admitting: Internal Medicine

## 2023-06-28 ENCOUNTER — Observation Stay

## 2023-06-28 ENCOUNTER — Encounter: Payer: Self-pay | Admitting: Internal Medicine

## 2023-06-28 DIAGNOSIS — I639 Cerebral infarction, unspecified: Secondary | ICD-10-CM | POA: Diagnosis not present

## 2023-06-28 LAB — LIPID PANEL
Cholesterol: 203 mg/dL — ABNORMAL HIGH (ref 0–200)
HDL: 84 mg/dL (ref >40)
LDL Cholesterol: 110 mg/dL — ABNORMAL HIGH (ref 0–99)
Total CHOL/HDL Ratio: 2.4 ratio
Triglycerides: 46 mg/dL (ref <150)
VLDL: 9 mg/dL (ref 0–40)

## 2023-06-28 LAB — ECHOCARDIOGRAM COMPLETE
AR max vel: 2.53 cm2
AV Peak grad: 5.9 mmHg
Ao pk vel: 1.21 m/s
Area-P 1/2: 3.27 cm2
Height: 69 in
S' Lateral: 2.9 cm
Weight: 2373.91 [oz_av]

## 2023-06-28 LAB — CREATININE, SERUM
Creatinine, Ser: 0.89 mg/dL (ref 0.61–1.24)
GFR, Estimated: >60 mL/min (ref >60)

## 2023-06-28 LAB — CBC
HCT: 39.6 % (ref 39.0–52.0)
Hemoglobin: 13.6 g/dL (ref 13.0–17.0)
MCH: 32.9 pg (ref 26.0–34.0)
MCHC: 34.3 g/dL (ref 30.0–36.0)
MCV: 95.7 fL (ref 80.0–100.0)
Platelets: 190 10*3/uL (ref 150–400)
RBC: 4.14 MIL/uL — ABNORMAL LOW (ref 4.22–5.81)
RDW: 15.5 % (ref 11.5–15.5)
WBC: 7.7 10*3/uL (ref 4.0–10.5)
nRBC: 0 % (ref 0.0–0.2)

## 2023-06-28 LAB — HEMOGLOBIN A1C
Hgb A1c MFr Bld: 5.9 % — ABNORMAL HIGH (ref 4.8–5.6)
Mean Plasma Glucose: 122.63 mg/dL

## 2023-06-28 MED ORDER — ROSUVASTATIN CALCIUM 20 MG PO TABS
20.0000 mg | ORAL_TABLET | Freq: Every day | ORAL | Status: DC
  Administered 2023-06-29: 20 mg via ORAL
  Filled 2023-06-28: qty 1

## 2023-06-28 MED ORDER — POTASSIUM CHLORIDE CRYS ER 20 MEQ PO TBCR
20.0000 meq | EXTENDED_RELEASE_TABLET | Freq: Once | ORAL | Status: AC
  Administered 2023-06-28: 20 meq via ORAL
  Filled 2023-06-28: qty 1

## 2023-06-28 MED ORDER — ACETAMINOPHEN 650 MG RE SUPP: 650.0000 mg | RECTAL | Status: DC | PRN

## 2023-06-28 MED ORDER — ACETAMINOPHEN 325 MG PO TABS: 650.0000 mg | ORAL_TABLET | ORAL | Status: DC | PRN

## 2023-06-28 MED ORDER — AMLODIPINE BESYLATE 5 MG PO TABS
5.0000 mg | ORAL_TABLET | Freq: Every day | ORAL | Status: DC
  Administered 2023-06-28 – 2023-06-29 (×2): 5 mg via ORAL
  Filled 2023-06-28 (×2): qty 1

## 2023-06-28 MED ORDER — ENOXAPARIN SODIUM 40 MG/0.4ML IJ SOSY
40.0000 mg | PREFILLED_SYRINGE | INTRAMUSCULAR | Status: DC
  Administered 2023-06-28 – 2023-06-29 (×2): 40 mg via SUBCUTANEOUS
  Filled 2023-06-28 (×2): qty 0.4

## 2023-06-28 MED ORDER — ACETAMINOPHEN 160 MG/5ML PO SOLN: 650.0000 mg | ORAL | Status: DC | PRN

## 2023-06-28 MED ORDER — SENNOSIDES-DOCUSATE SODIUM 8.6-50 MG PO TABS: 1.0000 | ORAL_TABLET | Freq: Every evening | ORAL | Status: DC | PRN

## 2023-06-28 MED ORDER — STROKE: EARLY STAGES OF RECOVERY BOOK: Freq: Once | Status: AC

## 2023-06-28 MED ORDER — LOSARTAN POTASSIUM 25 MG PO TABS
25.0000 mg | ORAL_TABLET | Freq: Every evening | ORAL | Status: DC
  Administered 2023-06-28: 25 mg via ORAL
  Filled 2023-06-28: qty 1

## 2023-06-28 MED ORDER — ASPIRIN 300 MG RE SUPP: 300.0000 mg | Freq: Every day | RECTAL | Status: DC

## 2023-06-28 MED ORDER — ASPIRIN 325 MG PO TABS
325.0000 mg | ORAL_TABLET | Freq: Every day | ORAL | Status: DC
  Administered 2023-06-28 – 2023-06-29 (×2): 325 mg via ORAL
  Filled 2023-06-28 (×2): qty 1

## 2023-06-28 MED ORDER — DICLOFENAC SODIUM 1 % EX GEL
2.0000 g | Freq: Three times a day (TID) | CUTANEOUS | Status: DC | PRN
  Administered 2023-06-28 – 2023-06-29 (×2): 2 g via TOPICAL
  Filled 2023-06-28: qty 100

## 2023-06-28 MED ORDER — SODIUM CHLORIDE 0.9 % IV SOLN: INTRAVENOUS | Status: DC

## 2023-06-28 NOTE — Evaluation (Signed)
 Speech Language Pathology Evaluation Patient Details Name: Terry Sheppard MRN: [id number] DOB: [birth year] Today's Date: 06/28/2023 Time: 5284-1324 SLP Time Calculation (min) (ACUTE ONLY): 15 min  Problem List:  Patient Active Problem List   Diagnosis Date Noted   Acute CVA (cerebrovascular accident) (HCC) 06/27/2023   S/P TKR (total knee replacement), left 01/22/2023   Former tobacco use 05/05/2021   GERD (gastroesophageal reflux disease) 05/05/2021   HLD (hyperlipidemia) 05/05/2021   Sigmoid diverticulosis 02/09/2020   IPMN (intraductal papillary mucinous neoplasm) 01/06/2020   Primary osteoarthritis of left knee 07/07/2018   Chronic obstructive pulmonary disease (HCC) 01/17/2018   Erectile dysfunction 01/17/2018   Aortic atherosclerosis (HCC) 11/08/2017   Controlled type 2 diabetes mellitus without complication, without long-term current use of insulin (HCC) 09/15/2015   Essential hypertension 04/15/2015   Past Medical History:  Past Medical History:  Diagnosis Date   Anxiety    Aortic atherosclerosis (HCC)    Controlled type 2 diabetes mellitus without complication, without long-term current use of insulin (HCC)    COPD (chronic obstructive pulmonary disease) (HCC)    no inhalers   Dysphagia, unspecified type    ED (erectile dysfunction)    Former tobacco use    GERD (gastroesophageal reflux disease)    occ   High cholesterol    Hypertension    IPMN (intraductal papillary mucinous neoplasm)    Pneumonia    Primary osteoarthritis of left knee    Sigmoid diverticulosis    Past Surgical History:  Past Surgical History:  Procedure Laterality Date   COLONOSCOPY     HERNIA REPAIR     INGUINAL HERNIA REPAIR Right 02/18/2021   Procedure: HERNIA REPAIR INGUINAL ADULT WITH MESH;  Surgeon: Conrado Delay, DO;  Location: [facility];  Service: General;  Laterality: Right;   TONSILLECTOMY     TOTAL KNEE ARTHROPLASTY Left 01/22/2023   Procedure: TOTAL KNEE ARTHROPLASTY;   Surgeon: Venus Ginsberg, MD;  Location: [facility];  Service: Orthopedics;  Laterality: Left;   WISDOM TOOTH EXTRACTION     XI ROBOTIC ASSISTED INGUINAL HERNIA REPAIR WITH MESH Right 06/13/2019   Procedure: XI ROBOTIC ASSISTED INGUINAL HERNIA REPAIR WITH MESH;  Surgeon: Conrado Delay, DO;  Location: [facility];  Service: General;  Laterality: Right;   XI ROBOTIC ASSISTED INGUINAL HERNIA REPAIR WITH MESH Left 02/03/2022   Procedure: XI ROBOTIC ASSISTED INGUINAL HERNIA REPAIR WITH MESH;  Surgeon: Conrado Delay, DO;  Location: [facility];  Service: General;  Laterality: Left;   HPI:  Terry Sheppard is a 78 y.o. male with c/po vision changes after waking up on 6/18. Pt with medical history significant of COPD, HTN,GERD,OA, HL. Pt with c/o of memory changes over the last several months. MRI, 06/27/23, 1. Questionable small acute subcortical infarcts within the frontal  lobes bilaterally.  2. Age-related atrophy and advanced diffuse cerebral white matter  disease with multiple chronic lacunar infarcts and numerous  scattered foci of chronic microhemorrhages.   Assessment / Plan / Recommendation Clinical Impression  Pt seen for cogntive-communication evaluation. Assessment completed via informal means and portions of [place]. Pt endorsed memory changes over the last several months. Pt presents with cognitive-communication deficits affecting attention (auditory, sustained, selective) and memory (immediate, short term, functional). Pt oriented to self, location, and situation, but not time (not date, DOW, or year). full awareness of deficits. Emotional lability/tearfulness appreciated. Recommend outpatient Neurology work up for further assessment of cognitive functioning with consideration for outpatient SLP services given aforementioned deficits. SLP to f/u acutely for memory  compensations and re-assessment, as appropriate.    SLP Assessment  SLP Recommendation/Assessment: Patient needs continued Speech  Language Pathology Services SLP Visit Diagnosis: Cognitive communication deficit (R41.841)     Assistance Recommended at Discharge  Intermittent Supervision/Assistance  Functional Status Assessment Patient has had a recent decline in their functional status and demonstrates the ability to make significant improvements in function in a reasonable and predictable amount of time.  Frequency and Duration min 2x/week  2 weeks      SLP Evaluation Cognition  Overall Cognitive Status: No family/caregiver present to determine baseline cognitive functioning Orientation Level: Oriented to person;Oriented to place;Oriented to situation (not to date, DOW, or year) Year: 2026 Attention: Sustained;Selective Sustained Attention: Impaired Selective Attention: Impaired Memory: Impaired Memory Impairment: Retrieval deficit;Decreased recall of new information;Decreased short term memory Decreased Short Term Memory: Verbal basic;Functional basic Awareness: Impaired Awareness Impairment: Emergent impairment Problem Solving: Appears intact (verbal) Behaviors: Lability       Comprehension  Auditory Comprehension Overall Auditory Comprehension: Appears within functional limits for tasks assessed (occasional repetition of stimuli; attention vs ?hearing) EffectiveTechniques: Extra processing time;Repetition    Expression Expression Primary Mode of Expression: Verbal Verbal Expression Overall Verbal Expression: Appears within functional limits for tasks assessed   Oral / Motor  Oral Motor/Sensory Function Overall Oral Motor/Sensory Function: Within functional limits Motor Speech Overall Motor Speech: Appears within functional limits for tasks assessed   Dia Forget, M.S., CCC-SLP Speech-Language Pathologist [facility] [phone] (ASCOM)         Adin Honour 06/28/2023, 10:38 AM

## 2023-06-28 NOTE — Progress Notes (Signed)
 [facility]  - [place] [facility]   PATIENT NAME: Terry Sheppard    MR#:  [id number]  DATE OF BIRTH:  02/03/1945  SUBJECTIVE:  patient's daughter and son at bedside. Patient has issues with memory is all the most of the time redirect able. Denies any complaints. Tolerating PO diet. Worked well with PT. No PT needs. Daughter plans to take patient home with her due to memory issues. No focal weakness. Vision improved then yesterday    VITALS:  Blood pressure (!) 176/82, pulse (!) 54, temperature (!) 97.4 F (36.3 C), resp. rate 18, height 5' 9 (1.753 m), weight 67.3 kg, SpO2 97%.  PHYSICAL EXAMINATION:   GENERAL:  78 y.o.-year-old patient with no acute distress.  LUNGS: Normal breath sounds bilaterally, no wheezing CARDIOVASCULAR: S1, S2 normal. No murmur   ABDOMEN: Soft, nontender, nondistended. Bowel sounds present.  EXTREMITIES: No  edema b/l.    NEUROLOGIC: nonfocal  patient is alert and awake no focal weakness. Speech clear.   LABORATORY PANEL:  CBC Recent Labs  Lab 06/28/23 0036  WBC 7.7  HGB 13.6  HCT 39.6  PLT 190    Chemistries  Recent Labs  Lab 06/27/23 0917 06/28/23 0036  NA 139  --   K 3.3*  --   CL 104  --   CO2 25  --   GLUCOSE 105*  --   BUN 14  --   CREATININE 0.84 0.89  CALCIUM 8.8*  --   AST 16  --   ALT 12  --   ALKPHOS 66  --   BILITOT 0.8  --    Cardiac Enzymes No results for input(s): TROPONINI in the last 168 hours. RADIOLOGY:  MR BRAIN WO CONTRAST Result Date: 06/27/2023 CLINICAL DATA:  Transient ischemic attack (TIA) EXAM: MRI HEAD WITHOUT CONTRAST TECHNIQUE: Multiplanar, multiecho pulse sequences of the brain and surrounding structures were obtained without intravenous contrast. COMPARISON:  CT head dated June 27, 2023. FINDINGS: Brain: There are punctate foci of high diffusion signal within the subcortical white matter of the right frontal lobe on image 40 of series 5 and there is a focus of high signal  present within the subcortical white matter of the left posterior frontal lobe on image 43. There is extensive diffuse cerebral white matter disease also present. There are chronic lacunar infarcts within the periventricular and deep cerebral white matter and within the right superior cerebellar hemisphere and left paracentral pons. There are numerous chronic microhemorrhages present within the subcortical white matter of the cerebral hemispheres and within the right basal ganglia and left thalamus. There also few scattered microhemorrhages within the cerebellar hemispheres bilaterally. Vascular: Normal vascular flow voids. Skull and upper cervical spine: Normal in signal intensity. No lesions are evident. Sinuses/Orbits: Opacification of the right posterior ethmoid air cells. The orbits are unremarkable. Other: None. IMPRESSION: 1. Questionable small acute subcortical infarcts within the frontal lobes bilaterally. 2. 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 Electronically Signed   By: Maribeth Shivers M.D.   On: 06/27/2023 13:11   DG Shoulder Left Result Date: 06/27/2023 CLINICAL DATA:  Shoulder pain.  No reported trauma EXAM: LEFT SHOULDER - 3 VIEW COMPARISON:  None Available. FINDINGS: No fracture or dislocation.  Preserved joint spaces.  Osteopenia. IMPRESSION: No acute osseous abnormality. Electronically Signed   By: Adrianna Horde M.D.   On: 06/27/2023 11:07   CT HEAD WO CONTRAST (  ) Result Date: 06/27/2023 CLINICAL DATA:  Syncope/presyncope. Presented with dizziness and right eye blurred vision upon waking up this morning. EXAM: CT HEAD WITHOUT CONTRAST TECHNIQUE: Contiguous axial images were obtained from the base of the skull through the vertex without intravenous contrast. RADIATION DOSE REDUCTION: This exam was performed according to the departmental dose-optimization  program which includes automated exposure control, adjustment of the mA and/or kV according to patient size and/or use of iterative reconstruction technique. COMPARISON:  CT head 05/04/2023. FINDINGS: Brain: No acute intracranial hemorrhage. No CT evidence of acute infarct. Nonspecific hypoattenuation in the periventricular and subcortical white matter favored to reflect chronic microvascular ischemic changes. 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 Additional small remote infarct in the superior right cerebellum. No edema, mass effect, or midline shift. The basilar cisterns are patent. Ventricles: The ventricles are normal. Vascular: Atherosclerotic calcifications of the carotid siphons and intracranial vertebral arteries. No hyperdense vessel. Skull: No acute or aggressive finding. Orbits: Orbits are symmetric. Sinuses: Mucosal thickening in the right posterior ethmoid air cells. Other: Mastoid air cells are clear. IMPRESSION: No CT evidence of acute intracranial abnormality. Moderate chronic microvascular ischemic changes and parenchymal volume loss. Remote infarcts in the right caudate, left thalamus, and right cerebellum. Electronically Signed   By: Denny Flack M.D.   On: 06/27/2023 10:47   Assessment and Plan  Terry Sheppard is a 78 y.o. male with medical history significant of COPD, HTN,GERD,OA, HL comes to the emergency room for complains of vision problem after he woke up today. Patient drove himself up. He tells me he has issues of memory lately. He also has been complaining of left shoulder pain. He reports he is not taking any medication for a long time and does not have a primary care.   Acute CVA-- bilateral small infarct in the frontal lobe -- aspirin 325 daily -- start statins -- cases discussed with Terry Sheppard (curbsided)-- agrees with current management. -- echo of the heart, PT OT-- no need --US  <50% bilateral stenosis in ICA -- lipid profile--elevated LDL  on statins -- A1c 5.9% -- echo of the heart pending --d/w dter to get eye exam due to blurred vision.   Uncontrolled hypertension -- in the setting of stroke will allow permissive hypertension. -- PRN hydralazine for systolic greater than 180 diastolic greater than 110 --add losartan --cont amlodipine   Memory issues/cognitive deficit/early dementia -- will need formal evaluation as outpatient--spoke with dter and she will try to find PCP in [place] (pt will be staying with her)   Left shoulder pain with restricted range of motion ?questionable frozen shoulder -- x-ray unremarkable -- PRN pain meds -- will refer to orthopedic at discharge           Advance Care Planning:   Code Status: Full Code   Consults: Neurology  Family Communication: dter at bedisde  DVT Prophylaxis :enoxaparin  Level of care: Telemetry Medical Status is: Observation The patient remains OBS appropriate and will d/c before 2 midnights.    TOTAL TIME TAKING CARE OF THIS PATIENT: 40 minutes.  >50% time spent on counselling and coordination of care  Note: This dictation was prepared with Dragon dictation along with smaller phrase technology. Any transcriptional errors that result from this process are unintentional.  Melvinia Stager M.D    [facility]   CC: Primary care physician; Morton Areas, PA-C

## 2023-06-28 NOTE — Evaluation (Signed)
 Occupational Therapy Evaluation Patient Details Name: Terry Sheppard MRN: [id number] DOB: 1945/07/26 Today's Date: 06/28/2023   History of Present Illness   Pt is a 78 y.o. male with medical history significant of COPD, HTN, GERD ,OA, and HL who presented to the emergency room for complains of vision problems and dizziness. MD assessment includes acute CVA, cognitive deficit, and L shoulder pain.   Clinical Impressions Terry Sheppard was seen for OT evaluation this date. Prior to hospital admission, pt was IND and living alone, on d/c plan to move to daughters house in [place]. Pt IND with ADLs and functional mobility. 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 Also noted STM deficits with poor awareness. Educated pt/family on stroke education, OT role, and d/c recs. No skilled acute OT needs identified, will sign off. On discharge recommend assist for cooking and medication mgmt, no OT needs.    If plan is discharge home, recommend the following:   Direct supervision/assist for medications management;Assistance with cooking/housework     Functional Status Assessment   Patient has had a recent decline in their functional status and demonstrates the ability to make significant improvements in function in a reasonable and predictable amount of time.     Equipment Recommendations   None recommended by OT     Recommendations for Other Services         Precautions/Restrictions   Precautions Precautions: None Recall of Precautions/Restrictions: Intact Restrictions Weight Bearing Restrictions Per Provider Order: No     Mobility Bed Mobility               General bed mobility comments: not tested    Transfers Overall transfer level: Independent                        Balance Overall balance assessment: No apparent balance deficits (not formally  assessed)                                         ADL either performed or assessed with clinical judgement   ADL Overall ADL's : Independent                                             Vision Baseline Vision/History: 1 Wears glasses Vision Assessment?: Vision impaired- to be further tested in functional context;Yes Tracking/Visual Pursuits: Decreased smoothness of eye movement to LEFT superior field Additional Comments: mildly decreased L peripheral vision     Perception         Praxis         Pertinent Vitals/Pain Pain Assessment Pain Assessment: No/denies pain     Extremity/Trunk Assessment Upper Extremity Assessment Upper Extremity Assessment: LUE deficits/detail RUE Deficits / Details: WFL RUE Sensation: WNL RUE Coordination: WNL LUE Deficits / Details: grip 4/5, bicep/tricep 5/5, shoulder testing limited by pain, 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 LUE Sensation: WNL LUE Coordination: WNL   Lower Extremity Assessment Lower Extremity Assessment: Overall WFL for tasks assessed RLE Deficits / Details: Strength via MMT: Hip flex 5/5, knee flex 5/5, knee ext 5/5, DF 5/5 RLE Sensation: WNL RLE Coordination: WNL LLE Deficits / Details: Strength via MMT: Hip flex 4/5, knee flex 5/5, knee ext 5/5, DF 4/5  LLE Sensation: WNL LLE Coordination: WNL       Communication Communication Communication: No apparent difficulties   Cognition Arousal: Alert Behavior During Therapy: WFL for tasks assessed/performed Cognition: History of cognitive impairments             OT - Cognition Comments: baseline STM deficits, family reports worse since stroke                 Following commands: Intact       Cueing  General Comments          Exercises     Shoulder Instructions      Home Living Family/patient expects to be discharged to:: Private residence Living Arrangements: Children Available Help at Discharge: Family Type of Home:  House Home Access: Stairs to enter Secretary/administrator of Steps: 10 Entrance Stairs-Rails: Right;Left;Can reach both Home Layout: One level     Bathroom Shower/Tub: Tub/shower unit;Walk-in shower   Bathroom Toilet: Standard     Home Equipment: Agricultural consultant (2 wheels);Cane - quad;Shower seat;Rollator (4 wheels);Cane - single point          Prior Functioning/Environment Prior Level of Function : Independent/Modified Independent;Driving             Mobility Comments: Pt reported independence with community ambulation without the use of AD. Pt reported no falls in past 6 months ADLs Comments: Pt reported independence with ADLs without the use of AD    OT Problem List: Decreased range of motion   OT Treatment/Interventions:        OT Goals(Current goals can be found in the care plan section)   Acute Rehab OT Goals Patient Stated Goal: to go home OT Goal Formulation: With patient Time For Goal Achievement: 06/28/23 Potential to Achieve Goals: Good   OT Frequency:       Co-evaluation              AM-PAC OT 6 Clicks Daily Activity     Outcome Measure Help from another person eating meals?: None Help from another person taking care of personal grooming?: None Help from another person toileting, which includes using toliet, bedpan, or urinal?: None Help from another person bathing (including washing, rinsing, drying)?: None Help from another person to put on and taking off regular upper body clothing?: None Help from another person to put on and taking off regular lower body clothing?: None 6 Click Score: 24   End of Session    Activity Tolerance: Patient tolerated treatment well Patient left: in chair;with call bell/phone within reach;with family/visitor present  OT Visit Diagnosis: Unsteadiness on feet (R26.81)                Time: 8657-8469 OT Time Calculation (min): 17 min Charges:  OT General Charges $OT Visit: 1 Visit OT Evaluation $OT  Eval Low Complexity: 1 Low  Gordan Latina, M.S. OTR/L  06/28/23, 11:49 AM  ascom [phone]

## 2023-06-28 NOTE — Progress Notes (Signed)
 Echocardiogram 2D Echocardiogram has been performed.  Terry Sheppard 06/28/2023, 4:15 PM

## 2023-06-28 NOTE — ED Notes (Signed)
 Reviewed chart sent secure message to ED nurse to update NIH assessment, last one documented was at 1 PM today.

## 2023-06-28 NOTE — Care Management Obs Status (Signed)
 MEDICARE OBSERVATION STATUS NOTIFICATION   Patient Details  Name: Terry Sheppard MRN: [id number] Date of Birth: 1945/06/17   Medicare Observation Status Notification Given:  Rudolph Cost, CMA 06/28/2023, 2:04 PM

## 2023-06-28 NOTE — Evaluation (Addendum)
 Physical Therapy Evaluation Patient Details Name: Terry Sheppard MRN: [id number] DOB: [birth year] Today's Date: 06/28/2023  History of Present Illness  Pt is a 78 y.o. male with medical history significant of COPD, HTN, GERD ,OA, and HL who presented to the emergency room for complains of vision problems and dizziness. MD assessment includes acute CVA, cognitive deficit, and L shoulder pain.  Clinical Impression  Pt was pleasant and motivated to participate during the session and put forth good effort throughout. Pt independent with all bed mobility tasks and CGA for STS and ambulation without AD. Pt required VC's during ambulation for proper direction and tended to drift left/right occasionally, but remained steady throughout and no LOBs observed. Pt was able to ascend/descend 10 steps with 1 handrail with CGA with no LOBs observed. Pt reported no adverse symptoms during the session with SpO2 and HR WNL throughout on room air. Pt will benefit from continued PT services upon discharge to safely address deficits listed in patient problem list for decreased caregiver assistance and eventual return to PLOF.        If plan is discharge home, recommend the following: A little help with walking and/or transfers;Assistance with cooking/housework;Assist for transportation;A little help with bathing/dressing/bathroom   Can travel by private vehicle        Equipment Recommendations None recommended by PT  Recommendations for Other Services       Functional Status Assessment Patient has had a recent decline in their functional status and demonstrates the ability to make significant improvements in function in a reasonable and predictable amount of time.     Precautions / Restrictions Precautions Precautions: None Restrictions Weight Bearing Restrictions Per Provider Order: No      Mobility  Bed Mobility Overal bed mobility: Independent             General bed mobility comments: Pt  independent with all bed mobility tasks without the use of physical assistance or bedrails    Transfers Overall transfer level: Needs assistance Equipment used: None Transfers: Sit to/from Stand Sit to Stand: Contact guard assist           General transfer comment: Pt completed STS from EOB with CGA with no unsteadiness or LOB    Ambulation/Gait Ambulation/Gait assistance: Contact guard assist Gait Distance (Feet): 200 Feet Assistive device: None Gait Pattern/deviations: Drifts right/left, Step-through pattern, Decreased step length - right, Decreased step length - left, Trunk flexed Gait velocity: decreased     General Gait Details: Pt remained steady throughout ambulation with no LOBs observed, despite VC's needed to keep ambulating in the correct direction  Stairs Stairs: Yes Stairs assistance: Contact guard assist Stair Management: One rail Right Number of Stairs: 10 General stair comments: Pt able to ascend/descend 10 stairs reciprocally given recent R TKA with CGA using 1 handrail with no LOBs observed  Wheelchair Mobility     Tilt Bed    Modified Rankin (Stroke Patients Only)       Balance Overall balance assessment: Needs assistance Sitting-balance support: Feet supported, No upper extremity supported Sitting balance-Leahy Scale: Normal Sitting balance - Comments: Pt able to maintain balance in sitting EOB without physical assistance or use of bedrails   Standing balance support: During functional activity, No upper extremity supported Standing balance-Leahy Scale: Good Standing balance comment: Pt required CGA for pt safety in static standing and ambulation, but no LOBs observed  Pertinent Vitals/Pain Pain Assessment Pain Assessment: 0-10 Pain Score: 7  Pain Location: L shoulder Pain Descriptors / Indicators: Aching, Grimacing, Other (Comment) (pain with movement, chronic issue) Pain Intervention(s): Monitored  during session    Home Living Family/patient expects to be discharged to:: Private residence (Currently lives alone, plans to d/c to daughter's home) Living Arrangements: Children Available Help at Discharge: Family Type of Home: House Home Access: Stairs to enter Entrance Stairs-Rails: Right;Left;Can reach both Secretary/administrator of Steps: 10   Home Layout: One level Home Equipment: Agricultural consultant (2 wheels);Cane - quad;Shower seat;Rollator (4 wheels);Cane - single point      Prior Function Prior Level of Function : Independent/Modified Independent;Driving             Mobility Comments: Pt reported independence with community ambulation without the use of AD. Pt reported no falls in past 6 months ADLs Comments: Pt reported independence with ADLs without the use of AD     Extremity/Trunk Assessment   Upper Extremity Assessment Upper Extremity Assessment: RUE deficits/detail;LUE deficits/detail RUE Deficits / Details: Strength via MMT: Shoulder flex 5/5, elbow flex 5/5, elbow ext 5/5, grip strength 5/5 RUE Sensation: WNL RUE Coordination: WNL LUE Deficits / Details: Strength via MMT: Shoulder flex 3+/5, elbow flex 4/5, elbow ext 4/5, grip strength 5/5 LUE Sensation: WNL LUE Coordination: WNL    Lower Extremity Assessment Lower Extremity Assessment: RLE deficits/detail;LLE deficits/detail RLE Deficits / Details: Strength via MMT: Hip flex 5/5, knee flex 5/5, knee ext 5/5, DF 5/5 RLE Sensation: WNL RLE Coordination: WNL LLE Deficits / Details: Strength via MMT: Hip flex 4/5, knee flex 5/5, knee ext 5/5, DF 4/5 LLE Sensation: WNL LLE Coordination: WNL       Communication   Communication Communication: No apparent difficulties    Cognition Arousal: Alert Behavior During Therapy: WFL for tasks assessed/performed   PT - Cognitive impairments: Memory                       PT - Cognition Comments: Pt required constant VC's for which direction to go  during ambulation Following commands: Intact       Cueing Cueing Techniques: Verbal cues, Gestural cues     General Comments      Exercises     Assessment/Plan    PT Assessment Patient needs continued PT services  PT Problem List Decreased strength;Decreased mobility;Decreased range of motion       PT Treatment Interventions Therapeutic exercise;Gait training;Balance training;Stair training;Functional mobility training;Therapeutic activities;Patient/family education    PT Goals (Current goals can be found in the Care Plan section)  Acute Rehab PT Goals Patient Stated Goal: to be able to perform ADLs without dizziness PT Goal Formulation: With patient Time For Goal Achievement: 07/11/23 Potential to Achieve Goals: Good    Frequency Min 2X/week     Co-evaluation               AM-PAC PT 6 Clicks Mobility  Outcome Measure Help needed turning from your back to your side while in a flat bed without using bedrails?: None Help needed moving from lying on your back to sitting on the side of a flat bed without using bedrails?: None Help needed moving to and from a bed to a chair (including a wheelchair)?: A Little Help needed standing up from a chair using your arms (e.g., wheelchair or bedside chair)?: A Little Help needed to walk in hospital room?: A Little Help needed climbing 3-5 steps with a railing? :  A Little 6 Click Score: 20    End of Session Equipment Utilized During Treatment: Gait belt Activity Tolerance: Patient tolerated treatment well Patient left: in chair;with call bell/phone within reach;with family/visitor present Nurse Communication: Mobility status PT Visit Diagnosis: Muscle weakness (generalized) (M62.81);Difficulty in walking, not elsewhere classified (R26.2)    Time: 1610-9604 PT Time Calculation (min) (ACUTE ONLY): 22 min   Charges:                 Rhoda Centers, SPT 06/28/23, 10:53 AM This entire session was performed under  direct supervision and direction of a licensed therapist/therapist assistant. I have personally read, edited and approve of the note as written.  Lonn Roads, DPT

## 2023-06-28 NOTE — Plan of Care (Signed)
  Problem: Clinical Measurements: Goal: Ability to maintain clinical measurements within normal limits will improve Outcome: Progressing Goal: Will remain free from infection Outcome: Progressing Goal: Diagnostic test results will improve Outcome: Progressing Goal: Respiratory complications will improve Outcome: Progressing Goal: Cardiovascular complication will be avoided Outcome: Progressing   Problem: Ischemic Stroke/TIA Tissue Perfusion: Goal: Complications of ischemic stroke/TIA will be minimized Outcome: Progressing   

## 2023-06-29 DIAGNOSIS — I10 Essential (primary) hypertension: Secondary | ICD-10-CM | POA: Diagnosis not present

## 2023-06-29 DIAGNOSIS — E785 Hyperlipidemia, unspecified: Secondary | ICD-10-CM | POA: Diagnosis not present

## 2023-06-29 DIAGNOSIS — I639 Cerebral infarction, unspecified: Secondary | ICD-10-CM | POA: Diagnosis not present

## 2023-06-29 MED ORDER — ASPIRIN 325 MG PO TABS: 325.0000 mg | ORAL_TABLET | Freq: Every day | ORAL | 2 refills | Status: AC

## 2023-06-29 MED ORDER — AMLODIPINE BESYLATE 5 MG PO TABS: 5.0000 mg | ORAL_TABLET | Freq: Every day | ORAL | 1 refills | Status: AC

## 2023-06-29 MED ORDER — ROSUVASTATIN CALCIUM 20 MG PO TABS: 20.0000 mg | ORAL_TABLET | Freq: Every day | ORAL | 1 refills | Status: AC

## 2023-06-29 MED ORDER — DICLOFENAC SODIUM 1 % EX GEL
2.0000 g | Freq: Three times a day (TID) | CUTANEOUS | 0 refills | Status: AC | PRN
Start: 2023-06-29 — End: ?

## 2023-06-29 MED ORDER — LOSARTAN POTASSIUM 25 MG PO TABS: 25.0000 mg | ORAL_TABLET | Freq: Every evening | ORAL | 1 refills | Status: AC

## 2023-06-29 NOTE — Discharge Summary (Addendum)
 Physician Discharge Summary   Patient: Terry Sheppard MRN: [id number] DOB: 1945/09/18  Admit date:     06/27/2023  Discharge date: 06/29/23  Discharge Physician: Melvinia Stager   PCP: Morton Areas, PA-C   Recommendations at discharge:    F/u PCP in 2 weeks  Discharge Diagnoses: Principal Problem:   Acute CVA (cerebrovascular accident) [facility])  Terry Sheppard is a 78 y.o. male with medical history significant of COPD, HTN,GERD,OA, HL comes to the emergency room for complains of vision problem after he woke up today. Patient drove himself up. He tells me he has issues of memory lately. He also has been complaining of left shoulder pain. He reports he is not taking any medication for a long time and does not have a primary care.   Acute CVA-- bilateral small infarct in the frontal lobe -- aspirin 325 daily -- start statins -- cases discussed with Dr. Doretta Gant (curbsided)-- agrees with current management. -- echo of the heart, PT OT-- no need --US  <50% bilateral stenosis in ICA -- lipid profile--elevated LDL on statins -- A1c 5.9% -- echo of the heart--EF 60-65% Grade I DD --vision improved.  Hyperlipidemia --started statins   Uncontrolled hypertension -- in the setting of stroke will allow permissive hypertension. -- PRN hydralazine for systolic greater than 180 diastolic greater than 110 --add losartan --cont amlodipine --BP much stable   Memory issues/cognitive deficit/early dementia -- will need formal evaluation as outpatient--spoke with dter and she will try to find PCP in [place] (pt will be staying with her)   Left shoulder pain with restricted range of motion ?questionable frozen shoulder -- x-ray unremarkable -- PRN pain meds -- will refer to orthopedic at discharge  Overall stable. D/c to home Pt will be going to [place] to stay with his daughter at discharge      Disposition: Home Diet recommendation:  Discharge Diet Orders (From admission,  onward)     Start     Ordered   06/29/23 0000  Diet - low sodium heart healthy        06/29/23 0846           Cardiac diet DISCHARGE MEDICATION: Allergies as of 06/29/2023   No Known Allergies      Medication List     STOP taking these medications    acetaminophen  500 MG tablet Commonly known as: TYLENOL    citalopram  20 MG tablet Commonly known as: CELEXA    docusate sodium  100 MG capsule Commonly known as: COLACE   ondansetron  4 MG tablet Commonly known as: ZOFRAN    oxyCODONE  5 MG immediate release tablet Commonly known as: Roxicodone    pravastatin  80 MG tablet Commonly known as: PRAVACHOL    traMADol  50 MG tablet Commonly known as: ULTRAM        TAKE these medications    amLODipine 5 MG tablet Commonly known as: NORVASC Take 1 tablet (5 mg total) by mouth daily. Start taking on: June 30, 2023   aspirin 325 MG tablet Take 1 tablet (325 mg total) by mouth daily. Start taking on: June 30, 2023   diclofenac Sodium 1 % Gel Commonly known as: VOLTAREN Apply 2 g topically 3 (three) times daily as needed (pain).   losartan 25 MG tablet Commonly known as: COZAAR Take 1 tablet (25 mg total) by mouth every evening.   meloxicam  15 MG tablet Commonly known as: MOBIC  Take 15 mg by mouth daily.   rosuvastatin 20 MG tablet Commonly known as: CRESTOR Take 1 tablet (20 mg total)  by mouth daily. Start taking on: June 30, 2023        Follow-up Information     Morton Areas, PA-C Follow up.   Specialty: Physician Assistant Why: hospital follow up Contact information: [address] [place] [address] [phone]                Discharge Exam: Filed Weights   06/27/23 0911 06/28/23 0111  Weight: 78.5 kg 67.3 kg     GENERAL:  78 y.o.-year-old patient with no acute distress.  LUNGS: Normal breath sounds bilaterally, no wheezing CARDIOVASCULAR: S1, S2 normal. No murmur   ABDOMEN: Soft, nontender, nondistended.  Bowel sounds present.  EXTREMITIES: No  edema b/l.    NEUROLOGIC: nonfocal  patient is alert and awake no focal weakness. Speech clear.  Condition at discharge: fair  The results of significant diagnostics from this hospitalization (including imaging, microbiology, ancillary and laboratory) are listed below for reference.   Imaging Studies: ECHOCARDIOGRAM COMPLETE Result Date: 06/28/2023    ECHOCARDIOGRAM REPORT   Patient Name:   Terry Sheppard Date of Exam: 06/28/2023 Medical Rec #:  [id number]          Height:       69.0 in Accession #:    [id number]         Weight:       148.4 lb Date of Birth:  [birth year]          BSA:          1.820 m Patient Age:    78 years           BP:           176/82 mmHg Patient Gender: M                  HR:           57 bpm. Exam Location:  [facility] Procedure: 2D Echo, Cardiac Doppler and Color Doppler (Both Spectral and Color            Flow Doppler were utilized during procedure). Indications:     Stroke I63.9  History:         Patient has no prior history of Echocardiogram examinations.                  Stroke and COPD; Risk Factors:Hypertension, Diabetes,                  Dyslipidemia and Former Smoker.  Sonographer:     Terrilee Few RCS Referring Phys:  [id number] Chameka Mcmullen Diagnosing Phys: Dwayne D Callwood MD IMPRESSIONS  1. Left ventricular ejection fraction, by estimation, is 60 to 65%. The left ventricle has normal function. The left ventricle has no regional wall motion abnormalities. Left ventricular diastolic parameters are consistent with Grade I diastolic dysfunction (impaired relaxation).  2. Right ventricular systolic function is normal. The right ventricular size is normal.  3. The mitral valve is normal in structure. Mild mitral valve regurgitation.  4. The aortic valve is normal in structure. Aortic valve regurgitation is not visualized. FINDINGS  Left Ventricle: Left ventricular ejection fraction, by estimation, is 60 to 65%. The left ventricle has normal  function. The left ventricle has no regional wall motion abnormalities. Strain was performed and the global longitudinal strain is indeterminate. Global longitudinal strain performed but not reported based on interpreter judgement due to suboptimal tracking. The left ventricular internal cavity size was normal in size. There is no left  ventricular hypertrophy. Left ventricular diastolic parameters are consistent with Grade I diastolic dysfunction (impaired relaxation). Right Ventricle: The right ventricular size is normal. No increase in right ventricular wall thickness. Right ventricular systolic function is normal. Left Atrium: Left atrial size was normal in size. Right Atrium: Right atrial size was normal in size. Pericardium: There is no evidence of pericardial effusion. Mitral Valve: The mitral valve is normal in structure. Mild mitral valve regurgitation. Tricuspid Valve: The tricuspid valve is normal in structure. Tricuspid valve regurgitation is mild. Aortic Valve: The aortic valve is normal in structure. Aortic valve regurgitation is not visualized. Aortic valve peak gradient measures 5.9 mmHg. Pulmonic Valve: The pulmonic valve was grossly normal. Pulmonic valve regurgitation is not visualized. Aorta: The ascending aorta was not well visualized. IAS/Shunts: No atrial level shunt detected by color flow Doppler. Additional Comments: 3D was performed not requiring image post processing on an independent workstation and was indeterminate.  LEFT VENTRICLE PLAX 2D LVIDd:         4.50 cm   Diastology LVIDs:         2.90 cm   LV e' medial:    9.25 cm/s LV PW:         1.10 cm   LV E/e' medial:  11.2 LV IVS:        0.80 cm   LV e' lateral:   11.70 cm/s LVOT diam:     1.90 cm   LV E/e' lateral: 8.9 LV SV:         64 LV SV Index:   35 LVOT Area:     2.84 cm  RIGHT VENTRICLE             IVC RV S prime:     21.40 cm/s  IVC diam: 2.10 cm TAPSE (M-mode): 3.1 cm LEFT ATRIUM             Index        RIGHT ATRIUM            Index LA diam:        4.00 cm 2.20 cm/m   RA Area:     14.10 cm LA Vol (A2C):   48.2 ml 26.49 ml/m  RA Volume:   34.10 ml  18.74 ml/m LA Vol (A4C):   57.3 ml 31.49 ml/m LA Biplane Vol: 52.6 ml 28.90 ml/m  AORTIC VALVE AV Area (Vmax): 2.53 cm AV Vmax:        121.00 cm/s AV Peak Grad:   5.9 mmHg LVOT Vmax:      108.00 cm/s LVOT Vmean:     70.600 cm/s LVOT VTI:       0.225 m  AORTA Ao Root diam: 3.40 cm MITRAL VALVE MV Area (PHT): 3.27 cm     SHUNTS MV Decel Time: 232 msec     Systemic VTI:  0.22 m MV E velocity: 104.00 cm/s  Systemic Diam: 1.90 cm MV A velocity: 107.00 cm/s MV E/A ratio:  0.97 Antonette Batters MD Electronically signed by Antonette Batters MD Signature Date/Time: 06/28/2023/6:42:40 PM    Final    US  Carotid Bilateral Result Date: 06/28/2023 CLINICAL DATA:  Cerebrovascular accident. EXAM: BILATERAL CAROTID DUPLEX ULTRASOUND TECHNIQUE: Martina Sledge scale imaging, color Doppler and duplex ultrasound were performed of bilateral carotid and vertebral arteries in the neck. COMPARISON:  None Available. FINDINGS: Criteria: Quantification of carotid stenosis is based on velocity parameters that correlate the residual internal carotid diameter with NASCET-based stenosis levels, using the diameter of  the distal internal carotid lumen as the denominator for stenosis measurement. The following velocity measurements were obtained: RIGHT ICA: 43.4/14.4 cm/sec CCA: 51/13 cm/sec SYSTOLIC ICA/CCA RATIO:  0.9 ECA: 61 cm/sec LEFT ICA: 82/29 cm/sec CCA: 52/7 cm/sec SYSTOLIC ICA/CCA RATIO:  1.6 ECA: 63 cm/sec RIGHT CAROTID ARTERY: The right common carotid artery demonstrates mixed smooth plaque. The right carotid bulb demonstrates mixed irregular plaque the right internal carotid artery demonstrates mixed irregular plaque RIGHT VERTEBRAL ARTERY:  Antegrade arterial flow LEFT CAROTID ARTERY: The left common carotid artery demonstrates smooth soft plaque. The carotid bulb demonstrates mixed irregular plaque. The left  internal carotid artery demonstrates irregular mixed plaque. LEFT VERTEBRAL ARTERY:  Antegrade arterial flow IMPRESSION: Less than 50% stenosis in bilateral internal carotid arteries. Electronically Signed   By: Susan Ensign   On: 06/28/2023 14:06   MR BRAIN WO CONTRAST Result Date: 06/27/2023 CLINICAL DATA:  Transient ischemic attack (TIA) EXAM: MRI HEAD WITHOUT CONTRAST TECHNIQUE: Multiplanar, multiecho pulse sequences of the brain and surrounding structures were obtained without intravenous contrast. COMPARISON:  CT head dated June 27, 2023. FINDINGS: Brain: There are punctate foci of high diffusion signal within the subcortical white matter of the right frontal lobe on image 40 of series 5 and there is a focus of high signal present within the subcortical white matter of the left posterior frontal lobe on image 43. There is extensive diffuse cerebral white matter disease also present. There are chronic lacunar infarcts within the periventricular and deep cerebral white matter and within the right superior cerebellar hemisphere and left paracentral pons. There are numerous chronic microhemorrhages present within the subcortical white matter of the cerebral hemispheres and within the right basal ganglia and left thalamus. There also few scattered microhemorrhages within the cerebellar hemispheres bilaterally. Vascular: Normal vascular flow voids. Skull and upper cervical spine: Normal in signal intensity. No lesions are evident. Sinuses/Orbits: Opacification of the right posterior ethmoid air cells. The orbits are unremarkable. Other: None. IMPRESSION: 1. Questionable small acute subcortical infarcts within the frontal lobes bilaterally. 2. 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 Electronically Signed   By: Maribeth Shivers M.D.   On: 06/27/2023 13:11   DG Shoulder Left Result  Date: 06/27/2023 CLINICAL DATA:  Shoulder pain.  No reported trauma EXAM: LEFT SHOULDER - 3 VIEW COMPARISON:  None Available. FINDINGS: No fracture or dislocation.  Preserved joint spaces.  Osteopenia. IMPRESSION: No acute osseous abnormality. Electronically Signed   By: Adrianna Horde M.D.   On: 06/27/2023 11:07   CT HEAD WO CONTRAST ( ) Result Date: 06/27/2023 CLINICAL DATA:  Syncope/presyncope. Presented with dizziness and right eye blurred vision upon waking up this morning. EXAM: CT HEAD WITHOUT CONTRAST TECHNIQUE: Contiguous axial images were obtained from the base of the skull through the vertex without intravenous contrast. RADIATION DOSE REDUCTION: This exam was performed according to the departmental dose-optimization program which includes automated exposure control, adjustment of the mA and/or kV according to patient size and/or use of iterative reconstruction technique. COMPARISON:  CT head 05/04/2023. FINDINGS: Brain: No acute intracranial hemorrhage. No CT evidence of acute infarct. Nonspecific hypoattenuation in the periventricular and subcortical white matter favored to reflect chronic microvascular ischemic changes. 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 Additional small remote infarct in the superior right cerebellum. No edema, mass effect, or midline shift. The basilar cisterns are patent. Ventricles: The ventricles are normal. Vascular: Atherosclerotic calcifications of  the carotid siphons and intracranial vertebral arteries. No hyperdense vessel. Skull: No acute or aggressive finding. Orbits: Orbits are symmetric. Sinuses: Mucosal thickening in the right posterior ethmoid air cells. Other: Mastoid air cells are clear. IMPRESSION: No CT evidence of acute intracranial abnormality. Moderate chronic microvascular ischemic changes and parenchymal volume loss. Remote infarcts in the right caudate, left thalamus, and right cerebellum. Electronically Signed   By: Denny Flack M.D.   On: 06/27/2023 10:47    Microbiology: Results for orders placed or performed during the hospital encounter of 12/11/22  Surgical pcr screen     Status: None   Collection Time: 12/11/22 10:04 AM   Specimen: Nasal Mucosa; Nasal Swab  Result Value Ref Range Status   MRSA, PCR NEGATIVE NEGATIVE Final   Staphylococcus aureus NEGATIVE NEGATIVE Final    Comment: (NOTE) The Xpert SA Assay (FDA approved for NASAL specimens in patients 39 years of age and older), is one component of a comprehensive surveillance program. It is not intended to diagnose infection nor to guide or monitor treatment. Performed at [facility], [address] [place], [place] [address]     Labs: CBC: Recent Labs  Lab 06/27/23 0917 06/28/23 0036  WBC 7.7 7.7  HGB 14.1 13.6  HCT 41.4 39.6  MCV 96.3 95.7  PLT 202 190   Basic Metabolic Panel: Recent Labs  Lab 06/27/23 0917 06/28/23 0036  NA 139  --   K 3.3*  --   CL 104  --   CO2 25  --   GLUCOSE 105*  --   BUN 14  --   CREATININE 0.84 0.89  CALCIUM 8.8*  --    Liver Function Tests: Recent Labs  Lab 06/27/23 0917  AST 16  ALT 12  ALKPHOS 66  BILITOT 0.8  PROT 6.6  ALBUMIN 3.9   CBG: Recent Labs  Lab 06/27/23 0914  GLUCAP 97    Discharge time spent: greater than 30 minutes.  Signed: Melvinia Stager, MD [facility] 06/29/2023

## 2023-06-29 NOTE — Progress Notes (Signed)
 SLP Cancellation Note  Patient Details Name: Terry Sheppard MRN: [id number] DOB: 25-Aug-1945   Cancelled treatment:       Reason Eval/Treat Not Completed: Other (comment) (Pt with imminent d/c. Pt planning to d/c home with daughter and to follow up with PCP/Neurology for further work up for cognitive changes. ST to sign off at this time.)  Dia Forget, M.S., CCC-SLP Speech-Language Pathologist [facility] [phone] [place])  Adin Honour 06/29/2023, 9:39 AM

## 2023-06-29 NOTE — Plan of Care (Signed)
  Problem: Education: Goal: Knowledge of secondary prevention will improve (MUST DOCUMENT ALL) Outcome: Progressing   Problem: Self-Care: Goal: Ability to participate in self-care as condition permits will improve Outcome: Progressing   Problem: Health Behavior/Discharge Planning: Goal: Ability to manage health-related needs will improve Outcome: Progressing   Problem: Activity: Goal: Risk for activity intolerance will decrease Outcome: Progressing

## 2023-06-29 NOTE — Discharge Instructions (Signed)
Keep log of BP at home and review with PCP

## 2023-09-23 IMAGING — MR MR ABDOMEN WO/W CM
19 of 21 series · 44 of 48 positions shown · IV contrast (7.5ml Gadavist)
Comparison: Multiple exams, including 08/05/2020

CLINICAL DATA: Pancreatic cystic lesion surveillance.

EXAM:
MRI ABDOMEN WITHOUT AND WITH CONTRAST
TECHNIQUE: Multiplanar multisequence MR imaging of the abdomen was performed
both before and after the administration of intravenous contrast.
CONTRAST:  7.5mL GADAVIST GADOBUTROL 1 MMOL/ML IV SOLN

[Series 3: T2 · coronal · 6.0mm · 1.19mm/px · 1 of 39 slices shown (1 of 2)]
[im 1/39]
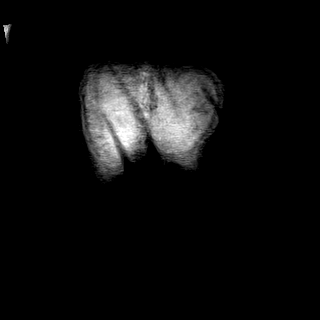

[Series 4: T2 · axial · 6.0mm · 1.19mm/px · 1 of 32 slices shown (2 of 2)]
[im 1/32]
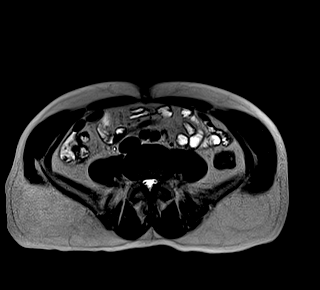

[Series 6: T2 fat-sat · axial · 6.0mm · 1.19mm/px · 1 of 34 slices shown]
[im 1/34]
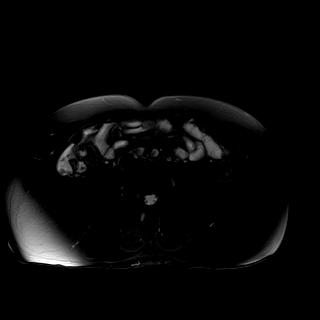

[Series 7: ax dwi_tracew · axial · 6.0mm · 1.42mm/px · z∈[-131,+107]mm · 3 of 102 slices shown]
[im 1/102]
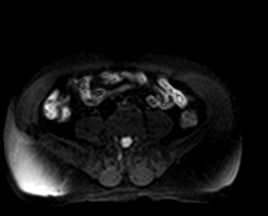
[im 51/102]
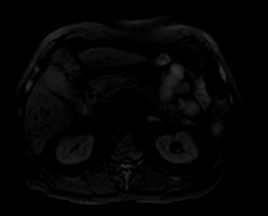
[im 102/102]
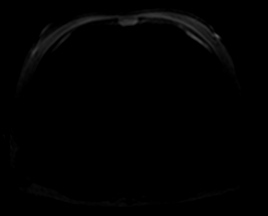

[Series 8: ax dwi_adc · axial · 6.0mm · 1.42mm/px · 1 of 34 slices shown]
[im 1/34]
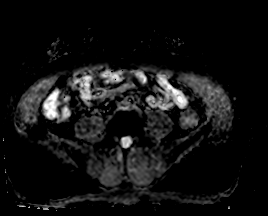

[Series 9: in & out · axial · 3.0mm · 1.19mm/px · z∈[-130,+107]mm · 2 of 80 slices shown (1 of 2)]
[im 1/80]
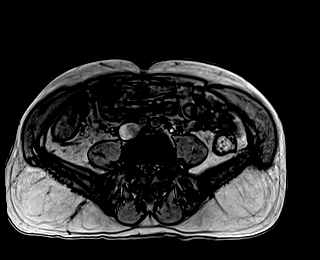
[im 80/80]
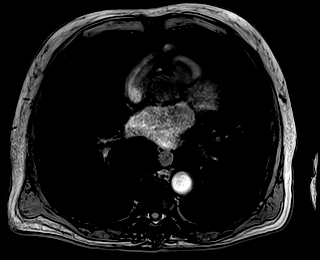

[Series 10: in & out · axial · 3.0mm · 1.19mm/px · z∈[-130,+107]mm · 3 of 80 slices shown (2 of 2)]
[im 1/80]
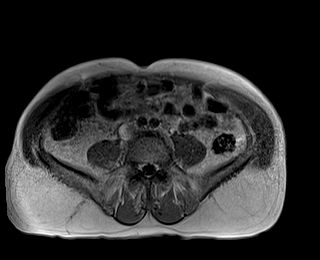
[im 40/80]
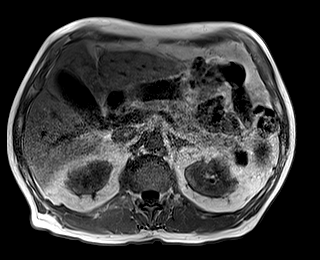
[im 80/80]
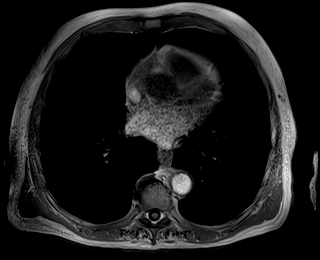

[Series 14: bSSFP · axial · 6.0mm · 0.74mm/px · 1 of 32 slices shown]
[im 1/32]
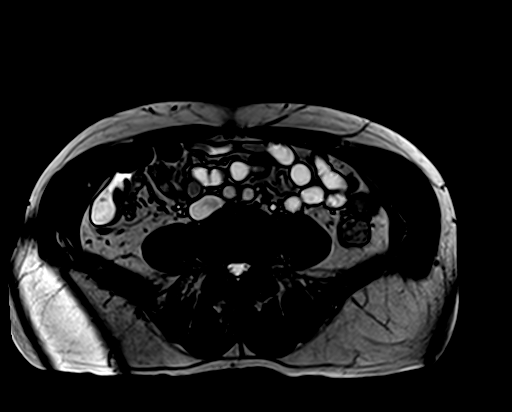

[Series 15: T1 dynamic fat-sat · axial · non-contrast · 3.0mm · 1.19mm/px · z∈[-130,+107]mm · 3 of 80 slices shown (1 of 5)]
[im 1/80]
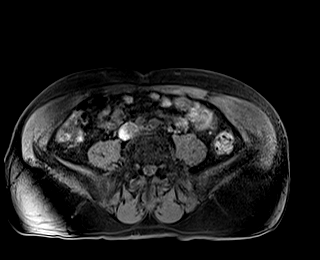
[im 40/80]
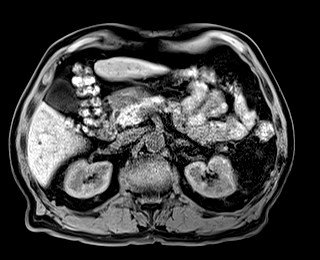
[im 80/80]
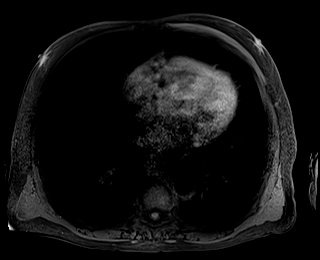

[Series 16: T1 dynamic fat-sat post-contrast · axial · 3.0mm · 1.19mm/px · z∈[-130,+107]mm · 3 of 80 slices shown (1 of 4)]
[im 1/80]
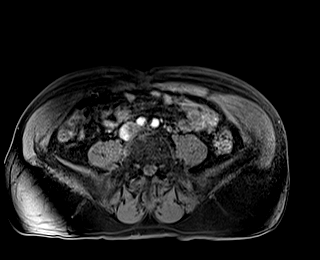
[im 40/80]
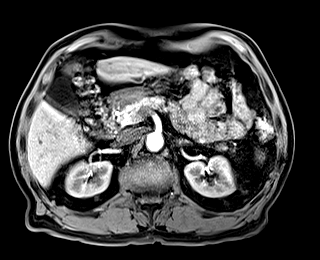
[im 80/80]
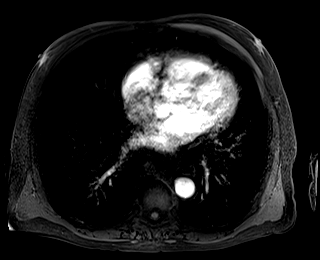

[Series 17: T1 dynamic fat-sat · axial · 3.0mm · 1.19mm/px · z∈[-130,+107]mm · 3 of 80 slices shown (2 of 5)]
[im 1/80]
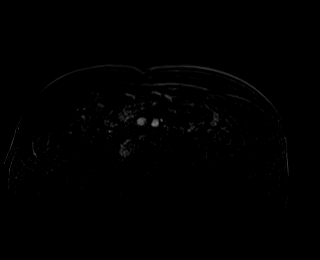
[im 40/80]
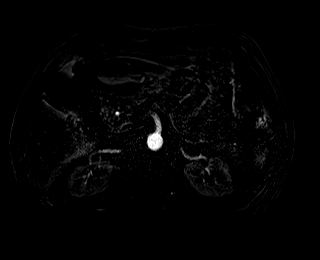
[im 80/80]
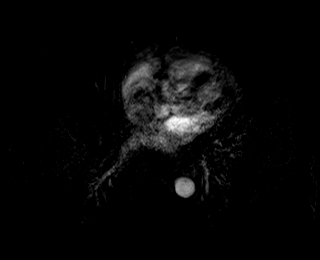

[Series 18: T1 dynamic fat-sat post-contrast · axial · 3.0mm · 1.19mm/px · z∈[-130,+107]mm · 3 of 80 slices shown (2 of 4)]
[im 1/80]
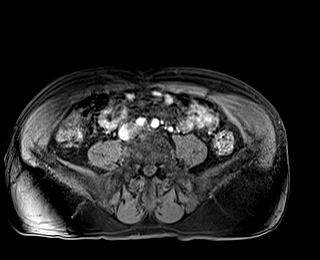
[im 40/80]
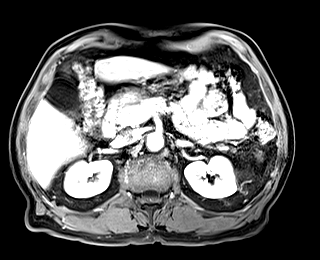
[im 80/80]
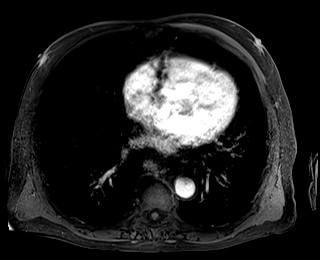

[Series 19: T1 dynamic fat-sat · axial · 3.0mm · 1.19mm/px · z∈[-130,+107]mm · 3 of 80 slices shown (3 of 5)]
[im 1/80]
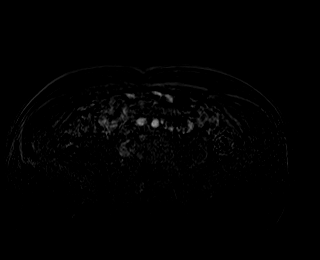
[im 40/80]
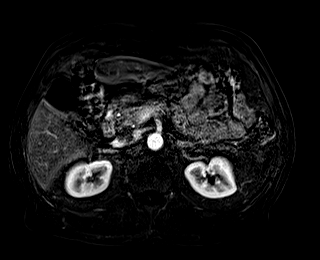
[im 80/80]
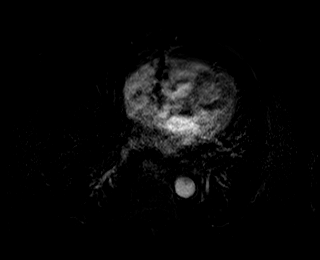

[Series 20: T1 dynamic fat-sat post-contrast · axial · 3.0mm · 1.19mm/px · z∈[-130,+107]mm · 3 of 80 slices shown (3 of 4)]
[im 1/80]
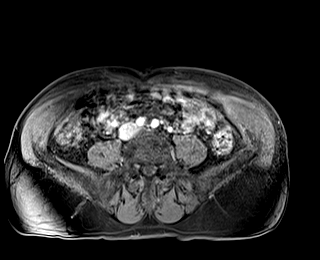
[im 40/80]
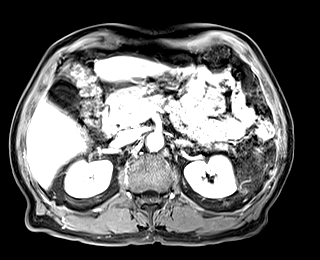
[im 80/80]
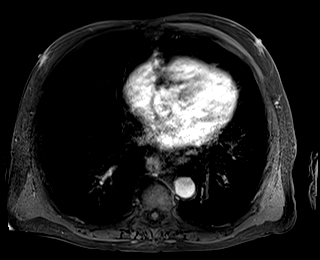

[Series 21: T1 dynamic fat-sat · axial · 3.0mm · 1.19mm/px · z∈[-130,+107]mm · 3 of 80 slices shown (4 of 5)]
[im 1/80]
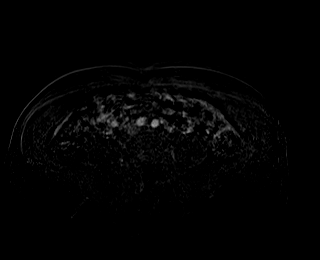
[im 40/80]
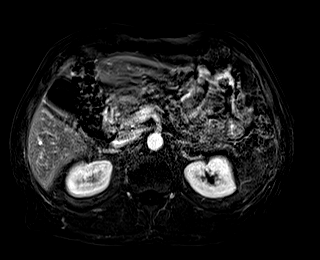
[im 80/80]
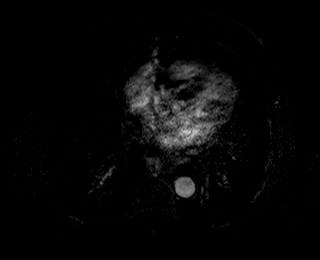

[Series 22: T1 dynamic post-contrast · coronal · 3.0mm · 1.31mm/px · 3 of 72 slices shown]
[im 1/72]
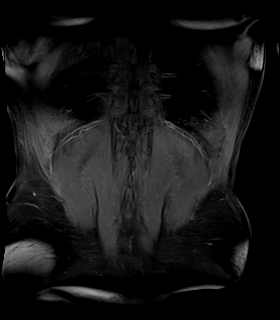
[im 36/72]
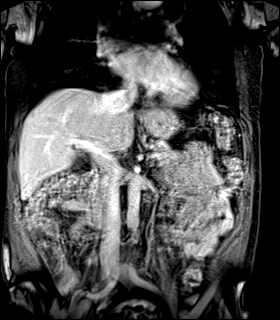
[im 72/72]
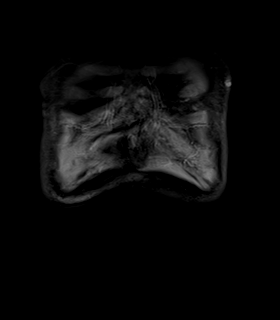

[Series 23: T1 dynamic fat-sat post-contrast · axial · 3.0mm · 1.19mm/px · z∈[-130,+107]mm · 3 of 80 slices shown (4 of 4)]
[im 1/80]
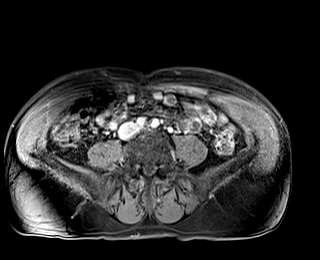
[im 40/80]
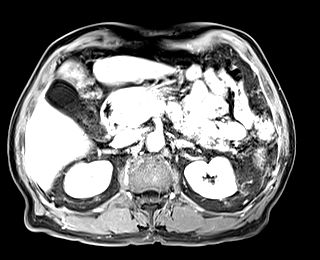
[im 80/80]
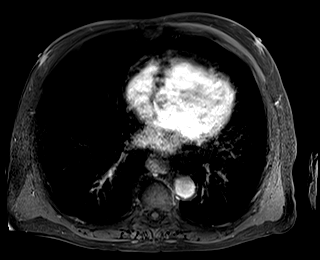

[Series 24: T1 dynamic fat-sat · axial · 3.0mm · 1.19mm/px · z∈[-130,+107]mm · 3 of 80 slices shown (5 of 5)]
[im 1/80]
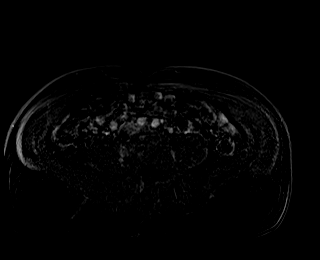
[im 40/80]
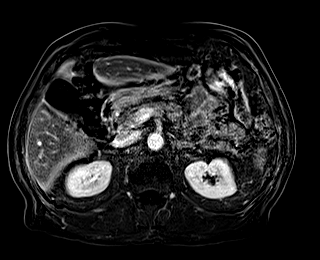
[im 80/80]
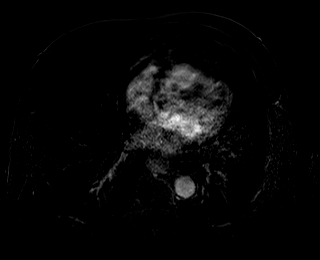

[Series 1017: h-f · axial · 0.6mm · 0.43mm/px · 1 of 15 slices shown]
[im 1/15]
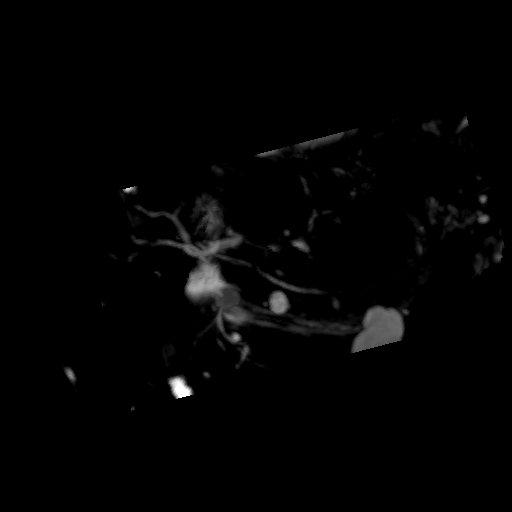

[44 of 48 positions shown; findings below may reference images not displayed]

FINDINGS: Despite efforts by the technologist and patient, motion artifact is
present on today's exam and could not be eliminated. This reduces
exam sensitivity and specificity.

Lower chest: Small to moderate-sized hiatal hernia.

Hepatobiliary: Hepatic cysts are again observed. Gallbladder
unremarkable. No biliary dilatation.

Pancreas: The dominant pancreatic tail lesion of concern measures
2.2 by 1.6 cm on image 14 series 4, when measured in the same
fashion on the prior exam it previously measured 2.2 by 1.5. This
does not qualify for formal interval growth. Other miniscule
pancreatic cystic lesions noted. Possible pancreas divisum. No
significant abnormal pancreatic parenchymal hypoenhancement.

Spleen:  Unremarkable

Adrenals/Urinary Tract:  Unremarkable

Stomach/Bowel: Unremarkable

Vascular/Lymphatic: Atherosclerosis is present, including aortoiliac
atherosclerotic disease.

Other:  No supplemental non-categorized findings.

Musculoskeletal: Unremarkable
IMPRESSION: 1. Roughly stable pancreatic tail cystic lesion currently measuring
2.2 by 1.6 cm. Possibilities include a postinflammatory cystic
lesion or intraductal papillary mucinous neoplasm. We have currently
established relative stability over 14 months. Current guidelines
call for follow up pancreatic protocol MRI in 6 months time. This
recommendation follows ACR consensus guidelines: Management of
Incidental Pancreatic Cysts: A White Paper of the ACR Incidental
Findings Committee. [HOSPITAL] 6999;[DATE].
2.  Aortic Atherosclerosis ([id number]).
3. Benign-appearing hepatic cysts.
4. Small to moderate-sized hiatal hernia.
